# Patient Record
Sex: Female | Born: 1935 | Race: Black or African American | Hispanic: No | Smoking: Never smoker
Health system: Southern US, Community
[De-identification: ages and names within clinical notes are randomized; demographics above are authoritative.]

## PROBLEM LIST (undated history)

## (undated) ENCOUNTER — Emergency Department: Source: Home / Self Care

## (undated) DIAGNOSIS — I10 Essential (primary) hypertension: Secondary | ICD-10-CM

## (undated) HISTORY — PX: HERNIA REPAIR: SHX51

---

## 2008-10-09 ENCOUNTER — Inpatient Hospital Stay: Payer: Self-pay | Admitting: Surgery

## 2012-08-09 ENCOUNTER — Ambulatory Visit: Payer: Self-pay | Admitting: Internal Medicine

## 2016-04-17 ENCOUNTER — Emergency Department: Payer: Medicare Other

## 2016-04-17 ENCOUNTER — Emergency Department
Admission: EM | Admit: 2016-04-17 | Discharge: 2016-04-17 | Disposition: A | Discharge status: Home / Self Care | Payer: Medicare Other | Attending: Emergency Medicine | Admitting: Emergency Medicine

## 2016-04-17 ENCOUNTER — Encounter: Payer: Self-pay | Admitting: Emergency Medicine

## 2016-04-17 DIAGNOSIS — Y939 Activity, unspecified: Secondary | ICD-10-CM | POA: Insufficient documentation

## 2016-04-17 DIAGNOSIS — M25532 Pain in left wrist: Secondary | ICD-10-CM | POA: Insufficient documentation

## 2016-04-17 DIAGNOSIS — S3992XA Unspecified injury of lower back, initial encounter: Secondary | ICD-10-CM | POA: Diagnosis present

## 2016-04-17 DIAGNOSIS — I10 Essential (primary) hypertension: Secondary | ICD-10-CM | POA: Insufficient documentation

## 2016-04-17 DIAGNOSIS — Y999 Unspecified external cause status: Secondary | ICD-10-CM | POA: Diagnosis not present

## 2016-04-17 DIAGNOSIS — W01190A Fall on same level from slipping, tripping and stumbling with subsequent striking against furniture, initial encounter: Secondary | ICD-10-CM | POA: Diagnosis not present

## 2016-04-17 DIAGNOSIS — S300XXA Contusion of lower back and pelvis, initial encounter: Secondary | ICD-10-CM | POA: Diagnosis not present

## 2016-04-17 DIAGNOSIS — W19XXXA Unspecified fall, initial encounter: Secondary | ICD-10-CM

## 2016-04-17 DIAGNOSIS — Y92009 Unspecified place in unspecified non-institutional (private) residence as the place of occurrence of the external cause: Secondary | ICD-10-CM | POA: Insufficient documentation

## 2016-04-17 HISTORY — DX: Essential (primary) hypertension: I10

## 2016-04-17 NOTE — ED Provider Notes (Signed)
[facility] Emergency Department Provider Note  ____________________________________________  Time seen: Approximately 7:04 PM  I have reviewed the triage vital signs and the nursing notes.   HISTORY  Chief Complaint Fall    HPI Cindy Meyer is a 83 y.o. female who had a slip and fall at home falling onto her buttocks. She also complains of some left wrist pain from where she put her hand down to try and catch herself. She slipped because her grandson had sprayed pledge furniture polish on the floor causing her to be slick. Denies any head injury loss of consciousness or back pain. No other complaints.     Past Medical History:  Diagnosis Date  . Hypertension      There are no active problems to display for this patient.    History reviewed. No pertinent surgical history.   Prior to Admission medications   Not on File  Losartan hydrochlorothiazide   Allergies Patient has no known allergies.   No family history on file.  Social History Social History  Substance Use Topics  . Smoking status: Never Smoker  . Smokeless tobacco: Never Used  . Alcohol use No    Review of Systems  Constitutional:   No fever or chills.  ENT:   No sore throat. No rhinorrhea. Cardiovascular:   No chest pain. Respiratory:   No dyspnea or cough. Gastrointestinal:   Negative for abdominal pain, vomiting and diarrhea.  Genitourinary:   Negative for dysuria or difficulty urinating. Musculoskeletal:   Sacral left wrist pain Neurological:   Negative for headaches 10-point ROS otherwise negative.  ____________________________________________   PHYSICAL EXAM:  VITAL SIGNS: ED Triage Vitals  Enc Vitals Group     BP --      Pulse --      Resp 04/17/16 1814 16     Temp --      Temp src --      SpO2 04/17/16 1814 98 %     Weight --      Height --      Head Circumference --      Peak Flow --      Pain Score 04/17/16 1815 3     Pain Loc --    Pain Edu? --      Excl. in GC? --     Vital signs reviewed, nursing assessments reviewed.   Constitutional:   Alert and oriented. Well appearing and in no distress. Eyes:   No scleral icterus. No conjunctival pallor. PERRL. EOMI.  No nystagmus. ENT   Head:   Normocephalic and atraumatic.   Nose:   No congestion/rhinnorhea. No septal hematoma   Mouth/Throat:   MMM, no pharyngeal erythema. No peritonsillar mass.    Neck:   No stridor. No SubQ emphysema. No meningismus. Hematological/Lymphatic/Immunilogical:   No cervical lymphadenopathy. Cardiovascular:   RRR. Symmetric bilateral radial and DP pulses.  No murmurs.  Respiratory:   Normal respiratory effort without tachypnea nor retractions. Breath sounds are clear and equal bilaterally. No wheezes/rales/rhonchi. Gastrointestinal:   Soft and nontender. Non distended. There is no CVA tenderness.  No rebound, rigidity, or guarding. Genitourinary:   deferred Musculoskeletal:   Normal range of motion in all extremities. No joint effusions.  No lower extremity tenderness.  No edema. No midline spinal tenderness. Pelvis stable and nontender. There is some mild tenderness along the distal left radius without snuffbox tenderness. Neurologic:   Normal speech and language.  CN 2-10 normal. Motor grossly intact. No gross  focal neurologic deficits are appreciated.  Skin:    Skin is warm, dry and intact. No rash noted.  No petechiae, purpura, or bullae.  ____________________________________________    LABS (pertinent positives/negatives) (all labs ordered are listed, but only abnormal results are displayed) Labs Reviewed - No data to display ____________________________________________   EKG    ____________________________________________    RADIOLOGY  Dg Wrist Complete Left  Result Date: 04/17/2016 CLINICAL DATA:  Fall with wrist pain EXAM: LEFT WRIST - COMPLETE 3+ VIEW COMPARISON:  None. FINDINGS: Mild osteopenia. No  definite acute displaced fracture. Soft tissues unremarkable. IMPRESSION: No definite acute osseous abnormality. Radiographic follow-up recommended if persistent clinical concern for fracture. Electronically Signed   By: Jasmine PangKim  Fujinaga M.D.   On: 04/17/2016 18:46    ____________________________________________   PROCEDURES Procedures  ____________________________________________   INITIAL IMPRESSION / ASSESSMENT AND PLAN / ED COURSE  Pertinent labs & imaging results that were available during my care of the patient were reviewed by me and considered in my medical decision making (see chart for details).  Patient well appearing no acute distress, presents with slip and fall at home, and minor blunt trauma. X-ray of left wrist unremarkable. No other evidence of any acute injury. We'll discharge home follow up with primary care.         ____________________________________________   FINAL CLINICAL IMPRESSION(S) / ED DIAGNOSES  Final diagnoses:  Fall, initial encounter  Sacral contusion, initial encounter  Left wrist pain      New Prescriptions   No medications on file     Portions of this note were generated with dragon dictation software. Dictation errors may occur despite best attempts at proofreading.    Sharman CheekPhillip Irini Leet, MD 04/17/16 [phone]

## 2016-04-17 NOTE — ED Triage Notes (Signed)
Patient was at home today and grandchild sprayed the floor with pledge furniture polish and she slipped and fell on her buttocks.  She is co sacral pain and left wrist pain.  She is able to flex and extend her wrist on command and reports 3/10 pain when rotating her left wrist.  MD at bedside during triage.

## 2016-04-17 NOTE — ED Notes (Signed)
XR bedside.

## 2016-04-17 NOTE — ED Notes (Signed)
Family at bedside. 

## 2016-04-17 NOTE — Discharge Instructions (Signed)
Your wrist xray did not show any fractures. Follow up with your doctor for continued monitoring of your symptoms.

## 2017-08-19 ENCOUNTER — Emergency Department
Admission: EM | Admit: 2017-08-19 | Discharge: 2017-08-19 | Disposition: A | Discharge status: Home / Self Care | Payer: Medicare Other | Attending: Emergency Medicine | Admitting: Emergency Medicine

## 2017-08-19 ENCOUNTER — Other Ambulatory Visit: Payer: Self-pay

## 2017-08-19 DIAGNOSIS — B9562 Methicillin resistant Staphylococcus aureus infection as the cause of diseases classified elsewhere: Secondary | ICD-10-CM | POA: Insufficient documentation

## 2017-08-19 DIAGNOSIS — Z23 Encounter for immunization: Secondary | ICD-10-CM | POA: Diagnosis not present

## 2017-08-19 DIAGNOSIS — I10 Essential (primary) hypertension: Secondary | ICD-10-CM | POA: Insufficient documentation

## 2017-08-19 DIAGNOSIS — R222 Localized swelling, mass and lump, trunk: Secondary | ICD-10-CM | POA: Diagnosis present

## 2017-08-19 DIAGNOSIS — Z79899 Other long term (current) drug therapy: Secondary | ICD-10-CM | POA: Diagnosis not present

## 2017-08-19 DIAGNOSIS — L02212 Cutaneous abscess of back [any part, except buttock]: Secondary | ICD-10-CM | POA: Insufficient documentation

## 2017-08-19 DIAGNOSIS — L0291 Cutaneous abscess, unspecified: Secondary | ICD-10-CM

## 2017-08-19 DIAGNOSIS — A4902 Methicillin resistant Staphylococcus aureus infection, unspecified site: Secondary | ICD-10-CM

## 2017-08-19 MED ORDER — OXYCODONE-ACETAMINOPHEN 5-325 MG PO TABS
1.0000 | ORAL_TABLET | Freq: Once | ORAL | Status: AC
  Administered 2017-08-19: 1 via ORAL
  Filled 2017-08-19: qty 1

## 2017-08-19 MED ORDER — LIDOCAINE HCL (PF) 1 % IJ SOLN
5.0000 mL | Freq: Once | INTRAMUSCULAR | Status: DC
  Filled 2017-08-19: qty 5

## 2017-08-19 MED ORDER — LIDOCAINE-EPINEPHRINE 1 %-1:100000 IJ SOLN
10.0000 mL | Freq: Once | INTRAMUSCULAR | Status: AC
  Administered 2017-08-19: 10 mL via INTRADERMAL
  Filled 2017-08-19 (×2): qty 10

## 2017-08-19 MED ORDER — LIDOCAINE HCL (PF) 1 % IJ SOLN: 5.0000 mL | Freq: Once | INTRAMUSCULAR | Status: DC

## 2017-08-19 MED ORDER — TETANUS-DIPHTH-ACELL PERTUSSIS 5-2.5-18.5 LF-MCG/0.5 IM SUSP
0.5000 mL | Freq: Once | INTRAMUSCULAR | Status: AC
  Administered 2017-08-19: 0.5 mL via INTRAMUSCULAR
  Filled 2017-08-19: qty 0.5

## 2017-08-19 NOTE — ED Provider Notes (Signed)
[facility] Emergency Department Provider Note  ____________________________________________   First MD Initiated Contact with Patient 08/19/17 2121     (approximate)  I have reviewed the triage vital signs and the nursing notes.   HISTORY  Chief Complaint Abscess   HPI Cindy Meyer is a 84 y.o. female who self presents to the emergency department with several days of a painful mass to her right upper back.  Insidious onset slowly progressive now moderate severity.  No fevers or chills.    Past Medical History:  Diagnosis Date  . Hypertension     There are no active problems to display for this patient.   History reviewed. No pertinent surgical history.  Prior to Admission medications   Medication Sig Start Date End Date Taking? Authorizing Provider  losartan-hydrochlorothiazide (HYZAAR) 50-12.5 MG tablet Take 1 tablet by mouth daily.   Yes [provider]  Multiple Vitamin (MULTIVITAMIN WITH MINERALS) TABS tablet Take 1 tablet by mouth daily.   Yes [provider]    Allergies Patient has no known allergies.  No family history on file.  Social History Social History   Tobacco Use  . Smoking status: Never Smoker  . Smokeless tobacco: Never Used  Substance Use Topics  . Alcohol use: No  . Drug use: Not on file    Review of Systems Constitutional: No fever/chills Cardiovascular: Denies chest pain. Respiratory: Denies shortness of breath. Gastrointestinal: No abdominal pain.  No nausea, no vomiting.   Musculoskeletal: Negative for back pain. Neurological: Negative for headaches   ____________________________________________   PHYSICAL EXAM:  VITAL SIGNS: ED Triage Vitals  Enc Vitals Group     BP 08/19/17 2115 (!) 201/105     Pulse Rate 08/19/17 2115 (!) 121     Resp 08/19/17 2115 18     Temp 08/19/17 2115 98 F (36.7 C)     Temp Source 08/19/17 2115 Oral     SpO2 08/19/17 2115 98 %     Weight  08/19/17 2110 110 lb (49.9 kg)     Height 08/19/17 2110 5\' 10"  (1.778 m)     Head Circumference --      Peak Flow --      Pain Score 08/19/17 2109 9     Pain Loc --      Pain Edu? --      Excl. in GC? --     Constitutional: Does not cooperative uncomfortable appearing Head: Atraumatic. Nose: No congestion/rhinnorhea. Mouth/Throat: No trismus Neck: No stridor.   Cardiovascular: Tachycardic Respiratory: Normal respiratory effort.  No retractions. Gastrointestinal: Soft nontender Neurologic:  Normal speech and language. No gross focal neurologic deficits are appreciated.  Skin: 7 cm abscess to right posterior shoulder coming to ahead with no overlying cellulitis.    ____________________________________________  LABS (all labs ordered are listed, but only abnormal results are displayed)  Labs Reviewed - No data to display   __________________________________________  EKG   ____________________________________________  RADIOLOGY   ____________________________________________   DIFFERENTIAL includes but not limited to  Abscess, cellulitis, necrotizing soft tissue infection, sebaceous cyst   PROCEDURES  Procedure(s) performed: Yes  .Marland Kitchen.Incision and Drainage Date/Time: 08/21/2017 2:04 AM Performed by: Merrily Brittleifenbark, Avacyn Kloosterman, MD Authorized by: Merrily Brittleifenbark, Poppi Scantling, MD   Consent:    Consent obtained:  Verbal   Consent given by:  Patient   Risks discussed:  Bleeding, infection, incomplete drainage and pain   Alternatives discussed:  Alternative treatment, delayed treatment and observation Location:    Type:  Abscess   Size:  7 cm   Location:  Trunk   Trunk location:  Back Pre-procedure details:    Skin preparation:  Chloraprep Anesthesia (see MAR for exact dosages):    Anesthesia method:  Local infiltration   Local anesthetic:  Lidocaine 1% WITH epi Procedure type:    Complexity:  Complex Procedure details:    Needle aspiration: no     Incision types:  Single  straight   Incision depth:  Dermal   Scalpel blade:  11   Wound management:  Probed and deloculated and debrided   Drainage:  Purulent   Drainage amount:  Moderate   Wound treatment:  Wound left open Post-procedure details:    Patient tolerance of procedure:  Tolerated well, no immediate complications    Critical Care performed: no  ____________________________________________   INITIAL IMPRESSION / ASSESSMENT AND PLAN / ED COURSE  Pertinent labs & imaging results that were available during my care of the patient were reviewed by me and considered in my medical decision making (see chart for details).   The patient arrives uncomfortable appearing with an obvious abscess.  Verbally consented for incision and drainage performed with removal of a copious amounts of purulent material.  All loculations broken up.  No indication for antibiotics.  Patient is discharged home with a 2-day wound check.      ____________________________________________   FINAL CLINICAL IMPRESSION(S) / ED DIAGNOSES  Final diagnoses:  Abscess  MRSA (methicillin resistant Staphylococcus aureus)      NEW MEDICATIONS STARTED DURING THIS VISIT:  Discharge Medication List as of 08/19/2017 10:56 PM       Note:  This document was prepared using Dragon voice recognition software and may include unintentional dictation errors.      Merrily Brittle, MD 08/21/17 [phone]

## 2017-08-19 NOTE — ED Triage Notes (Signed)
Pt arrives to ED via POV with c/o abscess on the right shoulder possibly related to an insect bite on Sunday. Pt has a very large, golf-ball sized area of swelling noted with a small area of yellow drainage at the site. Pt denies any c/o N/V/D or fever.

## 2017-08-19 NOTE — Discharge Instructions (Signed)
Please purchase over-the-counter HIBICLENS and wash with it several times to help prevent these infections in the future.  Make sure you follow-up with your primary care physician on Monday for a wound recheck and return to the emergency department for any concerns.  It was a pleasure to take care of you today, and thank you for coming to our emergency department.  If you have any questions or concerns before leaving please ask the nurse to grab me and I'm more than happy to go through your aftercare instructions again.  If you were prescribed any opioid pain medication today such as Norco, Vicodin, Percocet, morphine, hydrocodone, or oxycodone please make sure you do not drive when you are taking this medication as it can alter your ability to drive safely.  If you have any concerns once you are home that you are not improving or are in fact getting worse before you can make it to your follow-up appointment, please do not hesitate to call 911 and come back for further evaluation.  Merrily BrittleNeil Marcelus Dubberly, MD

## 2018-07-10 ENCOUNTER — Emergency Department: Payer: Medicare Other

## 2018-07-10 ENCOUNTER — Encounter: Payer: Self-pay | Admitting: Emergency Medicine

## 2018-07-10 ENCOUNTER — Inpatient Hospital Stay
Admission: EM | Admit: 2018-07-10 | Discharge: 2018-07-14 | DRG: 481 | Disposition: A | Discharge status: Skilled Nursing Facility | Payer: Medicare Other | Attending: Internal Medicine | Admitting: Internal Medicine

## 2018-07-10 ENCOUNTER — Other Ambulatory Visit: Payer: Self-pay

## 2018-07-10 DIAGNOSIS — K922 Gastrointestinal hemorrhage, unspecified: Secondary | ICD-10-CM | POA: Diagnosis present

## 2018-07-10 DIAGNOSIS — D62 Acute posthemorrhagic anemia: Secondary | ICD-10-CM | POA: Diagnosis present

## 2018-07-10 DIAGNOSIS — D649 Anemia, unspecified: Secondary | ICD-10-CM | POA: Diagnosis present

## 2018-07-10 DIAGNOSIS — I10 Essential (primary) hypertension: Secondary | ICD-10-CM | POA: Diagnosis present

## 2018-07-10 DIAGNOSIS — Z1159 Encounter for screening for other viral diseases: Secondary | ICD-10-CM

## 2018-07-10 DIAGNOSIS — W010XXA Fall on same level from slipping, tripping and stumbling without subsequent striking against object, initial encounter: Secondary | ICD-10-CM | POA: Diagnosis present

## 2018-07-10 DIAGNOSIS — S72001A Fracture of unspecified part of neck of right femur, initial encounter for closed fracture: Principal | ICD-10-CM | POA: Diagnosis present

## 2018-07-10 DIAGNOSIS — Y92018 Other place in single-family (private) house as the place of occurrence of the external cause: Secondary | ICD-10-CM

## 2018-07-10 DIAGNOSIS — S72009A Fracture of unspecified part of neck of unspecified femur, initial encounter for closed fracture: Secondary | ICD-10-CM | POA: Diagnosis present

## 2018-07-10 DIAGNOSIS — Z419 Encounter for procedure for purposes other than remedying health state, unspecified: Secondary | ICD-10-CM

## 2018-07-10 LAB — CBC WITH DIFFERENTIAL/PLATELET
Abs Immature Granulocytes: 0.02 10*3/uL (ref 0.00–0.07)
Basophils Absolute: 0 10*3/uL (ref 0.0–0.1)
Basophils Relative: 0 %
Eosinophils Absolute: 0.2 10*3/uL (ref 0.0–0.5)
Eosinophils Relative: 3 %
HCT: 38.8 % (ref 36.0–46.0)
Hemoglobin: 12 g/dL (ref 12.0–15.0)
Immature Granulocytes: 0 %
Lymphocytes Relative: 35 %
Lymphs Abs: 2.7 10*3/uL (ref 0.7–4.0)
MCH: 28.2 pg (ref 26.0–34.0)
MCHC: 30.9 g/dL (ref 30.0–36.0)
MCV: 91.3 fL (ref 80.0–100.0)
Monocytes Absolute: 0.7 10*3/uL (ref 0.1–1.0)
Monocytes Relative: 9 %
Neutro Abs: 4.1 10*3/uL (ref 1.7–7.7)
Neutrophils Relative %: 53 %
Platelets: 182 10*3/uL (ref 150–400)
RBC: 4.25 MIL/uL (ref 3.87–5.11)
RDW: 13.4 % (ref 11.5–15.5)
WBC: 7.8 10*3/uL (ref 4.0–10.5)
nRBC: 0 % (ref 0.0–0.2)

## 2018-07-10 LAB — COMPREHENSIVE METABOLIC PANEL
ALT: 13 U/L (ref 0–44)
AST: 59 U/L — ABNORMAL HIGH (ref 15–41)
Albumin: 3.8 g/dL (ref 3.5–5.0)
Alkaline Phosphatase: 62 U/L (ref 38–126)
Anion gap: 9 (ref 5–15)
BUN: 28 mg/dL — ABNORMAL HIGH (ref 8–23)
CO2: 27 mmol/L (ref 22–32)
Calcium: 9.4 mg/dL (ref 8.9–10.3)
Chloride: 104 mmol/L (ref 98–111)
Creatinine, Ser: 1.03 mg/dL — ABNORMAL HIGH (ref 0.44–1.00)
GFR calc Af Amer: 56 mL/min — ABNORMAL LOW (ref >60)
GFR calc non Af Amer: 48 mL/min — ABNORMAL LOW (ref >60)
Glucose, Bld: 132 mg/dL — ABNORMAL HIGH (ref 70–99)
Potassium: 3.2 mmol/L — ABNORMAL LOW (ref 3.5–5.1)
Sodium: 140 mmol/L (ref 135–145)
Total Bilirubin: 0.6 mg/dL (ref 0.3–1.2)
Total Protein: 8 g/dL (ref 6.5–8.1)

## 2018-07-10 MED ORDER — FENTANYL CITRATE (PF) 100 MCG/2ML IJ SOLN
12.5000 ug | INTRAMUSCULAR | Status: DC | PRN
  Administered 2018-07-10: 12.5 ug via INTRAVENOUS
  Filled 2018-07-10: qty 2

## 2018-07-10 NOTE — ED Provider Notes (Signed)
[facility] Emergency Department Provider Note    First MD Initiated Contact with Patient 07/10/18 2303     (approximate)  I have reviewed the triage vital signs and the nursing notes.   HISTORY  Chief Complaint Fall and Hip Pain    HPI Cindy Meyer is a 85 y.o. female below listed past medical history presents the ER for evaluation of right hip pain that occurred after mechanical fall this evening.  States she was at home got up to walk to the kitchen and tripped over a shoe that her family member had left on the floor.  She fell hitting her right hip against a washer.  Did not hit her head.  Was unable to bear weight after the fall.  Denies any chest pain or shortness of breath.  She is not on any anticoagulation or antiplatelet medications.  Denies any numbness or tingling.  States the pain is mild to moderate.    Past Medical History:  Diagnosis Date  . Hypertension    No family history on file. History reviewed. No pertinent surgical history. There are no active problems to display for this patient.     Prior to Admission medications   Medication Sig Start Date End Date Taking? Authorizing Provider  losartan-hydrochlorothiazide (HYZAAR) 50-12.5 MG tablet Take 1 tablet by mouth daily.    [provider]  Multiple Vitamin (MULTIVITAMIN WITH MINERALS) TABS tablet Take 1 tablet by mouth daily.    [provider]    Allergies Patient has no known allergies.    Social History Social History   Tobacco Use  . Smoking status: Never Smoker  . Smokeless tobacco: Never Used  Substance Use Topics  . Alcohol use: No  . Drug use: Never    Review of Systems Patient denies headaches, rhinorrhea, blurry vision, numbness, shortness of breath, chest pain, edema, cough, abdominal pain, nausea, vomiting, diarrhea, dysuria, fevers, rashes or hallucinations unless otherwise stated above in HPI.  ____________________________________________   PHYSICAL EXAM:  VITAL SIGNS: Vitals:   07/10/18 2249  Pulse: 94  Resp: 17  Temp: 98.6 F (37 C)  SpO2: 98%    Constitutional: Alert and oriented.  Eyes: Conjunctivae are normal.  Head: Atraumatic. Nose: No congestion/rhinnorhea. Mouth/Throat: Mucous membranes are moist.   Neck: No stridor. Painless ROM.  Cardiovascular: Normal rate, regular rhythm. Grossly normal heart sounds.  Good peripheral circulation. Respiratory: Normal respiratory effort.  No retractions. Lungs CTAB. Gastrointestinal: Soft and nontender. No distention. No abdominal bruits. No CVA tenderness. Genitourinary: deferred Musculoskeletal: Right lower extremity is shortened and externally rotated.  There is pain with palpation of the right hip.  Neurovascularly is intact distally.  Thigh compartment is soft and compressible..  No joint effusions. Neurologic:  Normal speech and language. No gross focal neurologic deficits are appreciated. No facial droop Skin:  Skin is warm, dry and intact. No rash noted. Psychiatric: Mood and affect are normal. Speech and behavior are normal.  ____________________________________________   LABS (all labs ordered are listed, but only abnormal results are displayed)  Results for orders placed or performed during the hospital encounter of 07/10/18 (from the past 24 hour(s))  CBC with Differential/Platelet     Status: None   Collection Time: 07/10/18 10:51 PM  Result Value Ref Range   WBC 7.8 4.0 - 10.5 K/uL   RBC 4.25 3.87 - 5.11 MIL/uL   Hemoglobin 12.0 12.0 - 15.0 g/dL   HCT 09.3 26.7 - 12.4 %   MCV  91.3 80.0 - 100.0 fL   MCH 28.2 26.0 - 34.0 pg   MCHC 30.9 30.0 - 36.0 g/dL   RDW 40.913.4 81.111.5 - 91.415.5 %   Platelets 182 150 - 400 K/uL   nRBC 0.0 0.0 - 0.2 %   Neutrophils Relative % 53 %   Neutro Abs 4.1 1.7 - 7.7 K/uL   Lymphocytes Relative 35 %   Lymphs Abs 2.7 0.7 - 4.0 K/uL   Monocytes Relative 9 %   Monocytes Absolute  0.7 0.1 - 1.0 K/uL   Eosinophils Relative 3 %   Eosinophils Absolute 0.2 0.0 - 0.5 K/uL   Basophils Relative 0 %   Basophils Absolute 0.0 0.0 - 0.1 K/uL   Immature Granulocytes 0 %   Abs Immature Granulocytes 0.02 0.00 - 0.07 K/uL  Comprehensive metabolic panel     Status: Abnormal   Collection Time: 07/10/18 10:51 PM  Result Value Ref Range   Sodium 140 135 - 145 mmol/L   Potassium 3.2 (L) 3.5 - 5.1 mmol/L   Chloride 104 98 - 111 mmol/L   CO2 27 22 - 32 mmol/L   Glucose, Bld 132 (H) 70 - 99 mg/dL   BUN 28 (H) 8 - 23 mg/dL   Creatinine, Ser 7.821.03 (H) 0.44 - 1.00 mg/dL   Calcium 9.4 8.9 - 95.610.3 mg/dL   Total Protein 8.0 6.5 - 8.1 g/dL   Albumin 3.8 3.5 - 5.0 g/dL   AST 59 (H) 15 - 41 U/L   ALT 13 0 - 44 U/L   Alkaline Phosphatase 62 38 - 126 U/L   Total Bilirubin 0.6 0.3 - 1.2 mg/dL   GFR calc non Af Amer 48 (L) >60 mL/min   GFR calc Af Amer 56 (L) >60 mL/min   Anion gap 9 5 - 15   ____________________________________________  EKG My review and personal interpretation at Time: 22:50   Indication: right hip pain  Rate: 95  Rhythm: sinus Axis: normal Other: irbbb, no stemi ____________________________________________  RADIOLOGY  I personally reviewed all radiographic images ordered to evaluate for the above acute complaints and reviewed radiology reports and findings.  These findings were personally discussed with the patient.  Please see medical record for radiology report.  ____________________________________________   PROCEDURES  Procedure(s) performed:  Procedures    Critical Care performed: no ____________________________________________   INITIAL IMPRESSION / ASSESSMENT AND PLAN / ED COURSE  Pertinent labs & imaging results that were available during my care of the patient were reviewed by me and considered in my medical decision making (see chart for details).   DDX: fracture, contusion, dislocation,   Cindy Meyer is a 85 y.o. who presents to the ED  with injury to her right hip as described above.  She is afebrile hemodynamically stable.  No evidence of head injury.  She is on blood thinners.  X-ray imaging shows evidence of right proximal femur oblique fracture.  Neurovascular is intact distally.  No evidence of compartment syndrome.  Discussed case in consultation with Dr. Rosita KeaMenz.  Patient will be admitted to the hospital for further medical as well as orthopedic management.  Have discussed with the patient and available family all diagnostics and treatments performed thus far and all questions were answered to the best of my ability. The patient demonstrates understanding and agreement with plan.      The patient was evaluated in Emergency Department today for the symptoms described in the history of present illness. He/she was evaluated in the context  of the global COVID-19 pandemic, which necessitated consideration that the patient might be at risk for infection with the SARS-CoV-2 virus that causes COVID-19. Institutional protocols and algorithms that pertain to the evaluation of patients at risk for COVID-19 are in a state of rapid change based on information released by regulatory bodies including the CDC and federal and state organizations. These policies and algorithms were followed during the patient's care in the ED.  As part of my medical decision making, I reviewed the following data within the electronic MEDICAL RECORD NUMBER Nursing notes reviewed and incorporated, Labs reviewed, notes from prior ED visits and [place] Controlled Substance Database   ____________________________________________   FINAL CLINICAL IMPRESSION(S) / ED DIAGNOSES  Final diagnoses:  Hip fx, right, closed, initial encounter (HCC)      NEW MEDICATIONS STARTED DURING THIS VISIT:  New Prescriptions   No medications on file     Note:  This document was prepared using Dragon voice recognition software and may include unintentional dictation errors.     Willy Eddy, MD 07/10/18 2358

## 2018-07-10 NOTE — ED Triage Notes (Signed)
Reports tripped over family members shoe, fell into the washing machine then to the floor.  Patient denies hitting head or loss of consciousness.

## 2018-07-11 ENCOUNTER — Encounter: Payer: Self-pay | Admitting: Internal Medicine

## 2018-07-11 ENCOUNTER — Inpatient Hospital Stay: Payer: Medicare Other | Admitting: Anesthesiology

## 2018-07-11 ENCOUNTER — Other Ambulatory Visit: Payer: Self-pay

## 2018-07-11 ENCOUNTER — Inpatient Hospital Stay: Payer: Medicare Other

## 2018-07-11 ENCOUNTER — Encounter
Admission: EM | Disposition: A | Discharge status: Skilled Nursing Facility | Payer: Self-pay | Source: Home / Self Care | Attending: Internal Medicine

## 2018-07-11 DIAGNOSIS — Y92018 Other place in single-family (private) house as the place of occurrence of the external cause: Secondary | ICD-10-CM | POA: Diagnosis not present

## 2018-07-11 DIAGNOSIS — W010XXA Fall on same level from slipping, tripping and stumbling without subsequent striking against object, initial encounter: Secondary | ICD-10-CM | POA: Diagnosis present

## 2018-07-11 DIAGNOSIS — K922 Gastrointestinal hemorrhage, unspecified: Secondary | ICD-10-CM | POA: Diagnosis present

## 2018-07-11 DIAGNOSIS — I10 Essential (primary) hypertension: Secondary | ICD-10-CM | POA: Diagnosis present

## 2018-07-11 DIAGNOSIS — S72001A Fracture of unspecified part of neck of right femur, initial encounter for closed fracture: Secondary | ICD-10-CM | POA: Diagnosis present

## 2018-07-11 DIAGNOSIS — Z1159 Encounter for screening for other viral diseases: Secondary | ICD-10-CM | POA: Diagnosis not present

## 2018-07-11 DIAGNOSIS — S72009A Fracture of unspecified part of neck of unspecified femur, initial encounter for closed fracture: Secondary | ICD-10-CM | POA: Diagnosis present

## 2018-07-11 DIAGNOSIS — D649 Anemia, unspecified: Secondary | ICD-10-CM | POA: Diagnosis present

## 2018-07-11 DIAGNOSIS — D62 Acute posthemorrhagic anemia: Secondary | ICD-10-CM | POA: Diagnosis present

## 2018-07-11 HISTORY — PX: INTRAMEDULLARY (IM) NAIL INTERTROCHANTERIC: SHX5875

## 2018-07-11 LAB — BASIC METABOLIC PANEL
Anion gap: 9 (ref 5–15)
BUN: 26 mg/dL — ABNORMAL HIGH (ref 8–23)
CO2: 28 mmol/L (ref 22–32)
Calcium: 9 mg/dL (ref 8.9–10.3)
Chloride: 105 mmol/L (ref 98–111)
Creatinine, Ser: 0.98 mg/dL (ref 0.44–1.00)
GFR calc Af Amer: 59 mL/min — ABNORMAL LOW (ref >60)
GFR calc non Af Amer: 51 mL/min — ABNORMAL LOW (ref >60)
Glucose, Bld: 165 mg/dL — ABNORMAL HIGH (ref 70–99)
Potassium: 3.5 mmol/L (ref 3.5–5.1)
Sodium: 142 mmol/L (ref 135–145)

## 2018-07-11 LAB — CBC
HCT: 34.5 % — ABNORMAL LOW (ref 36.0–46.0)
Hemoglobin: 10.8 g/dL — ABNORMAL LOW (ref 12.0–15.0)
MCH: 28.2 pg (ref 26.0–34.0)
MCHC: 31.3 g/dL (ref 30.0–36.0)
MCV: 90.1 fL (ref 80.0–100.0)
Platelets: 180 10*3/uL (ref 150–400)
RBC: 3.83 MIL/uL — ABNORMAL LOW (ref 3.87–5.11)
RDW: 13.3 % (ref 11.5–15.5)
WBC: 8.1 10*3/uL (ref 4.0–10.5)
nRBC: 0 % (ref 0.0–0.2)

## 2018-07-11 LAB — PROTIME-INR
INR: 1 (ref 0.8–1.2)
Prothrombin Time: 13 seconds (ref 11.4–15.2)

## 2018-07-11 LAB — SURGICAL PCR SCREEN
MRSA, PCR: NEGATIVE
Staphylococcus aureus: NEGATIVE

## 2018-07-11 LAB — ABO/RH: ABO/RH(D): A POS

## 2018-07-11 LAB — SARS CORONAVIRUS 2 BY RT PCR (HOSPITAL ORDER, PERFORMED IN ~~LOC~~ HOSPITAL LAB): SARS Coronavirus 2: NEGATIVE

## 2018-07-11 SURGERY — FIXATION, FRACTURE, INTERTROCHANTERIC, WITH INTRAMEDULLARY ROD
Anesthesia: General | Site: Hip | Laterality: Right

## 2018-07-11 MED ORDER — MORPHINE SULFATE (PF) 2 MG/ML IV SOLN
2.0000 mg | INTRAVENOUS | Status: DC | PRN
  Administered 2018-07-11 (×2): 2 mg via INTRAVENOUS
  Filled 2018-07-11 (×2): qty 1

## 2018-07-11 MED ORDER — SUCCINYLCHOLINE CHLORIDE 20 MG/ML IJ SOLN
INTRAMUSCULAR | Status: AC
  Filled 2018-07-11: qty 1

## 2018-07-11 MED ORDER — ACETAMINOPHEN 325 MG PO TABS
650.0000 mg | ORAL_TABLET | Freq: Four times a day (QID) | ORAL | Status: DC | PRN
  Filled 2018-07-11: qty 2

## 2018-07-11 MED ORDER — MUPIROCIN 2 % EX OINT
1.0000 "application " | TOPICAL_OINTMENT | Freq: Two times a day (BID) | CUTANEOUS | Status: DC
  Filled 2018-07-11: qty 22

## 2018-07-11 MED ORDER — ONDANSETRON HCL 4 MG PO TABS: 4.0000 mg | ORAL_TABLET | Freq: Four times a day (QID) | ORAL | Status: DC | PRN

## 2018-07-11 MED ORDER — PANTOPRAZOLE SODIUM 40 MG IV SOLR
8.0000 mg/h | INTRAVENOUS | Status: DC
  Administered 2018-07-11: 8 mg/h via INTRAVENOUS
  Filled 2018-07-11: qty 80

## 2018-07-11 MED ORDER — MORPHINE SULFATE (PF) 2 MG/ML IV SOLN: 0.5000 mg | INTRAVENOUS | Status: DC | PRN

## 2018-07-11 MED ORDER — BISACODYL 10 MG RE SUPP
10.0000 mg | Freq: Every day | RECTAL | Status: DC | PRN
  Administered 2018-07-14: 15:00:00 10 mg via RECTAL
  Filled 2018-07-11: qty 1

## 2018-07-11 MED ORDER — METOCLOPRAMIDE HCL 5 MG/ML IJ SOLN: 5.0000 mg | Freq: Three times a day (TID) | INTRAMUSCULAR | Status: DC | PRN

## 2018-07-11 MED ORDER — ROCURONIUM BROMIDE 100 MG/10ML IV SOLN
INTRAVENOUS | Status: DC | PRN
  Administered 2018-07-11: 30 mg via INTRAVENOUS

## 2018-07-11 MED ORDER — CEFAZOLIN SODIUM-DEXTROSE 1-4 GM/50ML-% IV SOLN
1.0000 g | Freq: Four times a day (QID) | INTRAVENOUS | Status: AC
  Administered 2018-07-11 – 2018-07-12 (×3): 1 g via INTRAVENOUS
  Filled 2018-07-11 (×3): qty 50

## 2018-07-11 MED ORDER — MENTHOL 3 MG MT LOZG
1.0000 | LOZENGE | OROMUCOSAL | Status: DC | PRN
  Filled 2018-07-11: qty 9

## 2018-07-11 MED ORDER — ESMOLOL HCL 100 MG/10ML IV SOLN
INTRAVENOUS | Status: DC | PRN
  Administered 2018-07-11: 10 mg via INTRAVENOUS

## 2018-07-11 MED ORDER — CEFAZOLIN SODIUM-DEXTROSE 1-4 GM/50ML-% IV SOLN
1.0000 g | Freq: Once | INTRAVENOUS | Status: AC
  Administered 2018-07-11: 17:00:00 1 g via INTRAVENOUS
  Filled 2018-07-11: qty 50

## 2018-07-11 MED ORDER — ONDANSETRON HCL 4 MG/2ML IJ SOLN
INTRAMUSCULAR | Status: AC
  Filled 2018-07-11: qty 2

## 2018-07-11 MED ORDER — LIDOCAINE HCL (CARDIAC) PF 100 MG/5ML IV SOSY
PREFILLED_SYRINGE | INTRAVENOUS | Status: DC | PRN
  Administered 2018-07-11: 50 mg via INTRAVENOUS

## 2018-07-11 MED ORDER — ESMOLOL HCL 100 MG/10ML IV SOLN
INTRAVENOUS | Status: AC
  Filled 2018-07-11: qty 10

## 2018-07-11 MED ORDER — METOCLOPRAMIDE HCL 10 MG PO TABS: 5.0000 mg | ORAL_TABLET | Freq: Three times a day (TID) | ORAL | Status: DC | PRN

## 2018-07-11 MED ORDER — PHENOL 1.4 % MT LIQD
1.0000 | OROMUCOSAL | Status: DC | PRN
  Filled 2018-07-11: qty 177

## 2018-07-11 MED ORDER — ROCURONIUM BROMIDE 50 MG/5ML IV SOLN
INTRAVENOUS | Status: AC
  Filled 2018-07-11: qty 1

## 2018-07-11 MED ORDER — ENOXAPARIN SODIUM 30 MG/0.3ML ~~LOC~~ SOLN
30.0000 mg | SUBCUTANEOUS | Status: DC
  Administered 2018-07-12 – 2018-07-14 (×3): 30 mg via SUBCUTANEOUS
  Filled 2018-07-11 (×3): qty 0.3

## 2018-07-11 MED ORDER — ACETAMINOPHEN 10 MG/ML IV SOLN
INTRAVENOUS | Status: AC
  Filled 2018-07-11: qty 100

## 2018-07-11 MED ORDER — LACTATED RINGERS IV SOLN
INTRAVENOUS | Status: DC
  Administered 2018-07-11 (×2): via INTRAVENOUS

## 2018-07-11 MED ORDER — ONDANSETRON HCL 4 MG/2ML IJ SOLN
4.0000 mg | Freq: Once | INTRAMUSCULAR | Status: AC
  Administered 2018-07-11: 4 mg via INTRAVENOUS
  Filled 2018-07-11: qty 2

## 2018-07-11 MED ORDER — DOCUSATE SODIUM 100 MG PO CAPS
100.0000 mg | ORAL_CAPSULE | Freq: Two times a day (BID) | ORAL | Status: DC
  Administered 2018-07-11 – 2018-07-14 (×6): 100 mg via ORAL
  Filled 2018-07-11 (×6): qty 1

## 2018-07-11 MED ORDER — DEXAMETHASONE SODIUM PHOSPHATE 10 MG/ML IJ SOLN
INTRAMUSCULAR | Status: AC
  Filled 2018-07-11: qty 1

## 2018-07-11 MED ORDER — ACETAMINOPHEN 500 MG PO TABS
500.0000 mg | ORAL_TABLET | Freq: Four times a day (QID) | ORAL | Status: AC
  Administered 2018-07-11 – 2018-07-12 (×4): 500 mg via ORAL
  Filled 2018-07-11 (×4): qty 1

## 2018-07-11 MED ORDER — PHENYLEPHRINE HCL (PRESSORS) 10 MG/ML IV SOLN
INTRAVENOUS | Status: DC | PRN
  Administered 2018-07-11: 100 ug via INTRAVENOUS
  Administered 2018-07-11: 150 ug via INTRAVENOUS
  Administered 2018-07-11 (×2): 100 ug via INTRAVENOUS

## 2018-07-11 MED ORDER — HYDROCODONE-ACETAMINOPHEN 5-325 MG PO TABS
1.0000 | ORAL_TABLET | ORAL | Status: DC | PRN
  Administered 2018-07-12 – 2018-07-14 (×3): 1 via ORAL
  Filled 2018-07-11 (×3): qty 1

## 2018-07-11 MED ORDER — SUGAMMADEX SODIUM 200 MG/2ML IV SOLN
INTRAVENOUS | Status: AC
  Filled 2018-07-11: qty 2

## 2018-07-11 MED ORDER — PROPOFOL 10 MG/ML IV BOLUS
INTRAVENOUS | Status: DC | PRN
  Administered 2018-07-11: 80 mg via INTRAVENOUS

## 2018-07-11 MED ORDER — MAGNESIUM CITRATE PO SOLN
1.0000 | Freq: Once | ORAL | Status: DC | PRN
  Filled 2018-07-11: qty 296

## 2018-07-11 MED ORDER — FENTANYL CITRATE (PF) 100 MCG/2ML IJ SOLN
INTRAMUSCULAR | Status: AC
  Filled 2018-07-11: qty 2

## 2018-07-11 MED ORDER — ALUM & MAG HYDROXIDE-SIMETH 200-200-20 MG/5ML PO SUSP: 30.0000 mL | ORAL | Status: DC | PRN

## 2018-07-11 MED ORDER — METHOCARBAMOL 1000 MG/10ML IJ SOLN
500.0000 mg | Freq: Four times a day (QID) | INTRAVENOUS | Status: DC | PRN
  Filled 2018-07-11: qty 5

## 2018-07-11 MED ORDER — ONDANSETRON HCL 4 MG/2ML IJ SOLN
INTRAMUSCULAR | Status: DC | PRN
  Administered 2018-07-11: 4 mg via INTRAVENOUS

## 2018-07-11 MED ORDER — LIDOCAINE HCL (PF) 2 % IJ SOLN
INTRAMUSCULAR | Status: AC
  Filled 2018-07-11: qty 10

## 2018-07-11 MED ORDER — FENTANYL CITRATE (PF) 100 MCG/2ML IJ SOLN: 25.0000 ug | INTRAMUSCULAR | Status: DC | PRN

## 2018-07-11 MED ORDER — SODIUM CHLORIDE 0.9 % IV SOLN
INTRAVENOUS | Status: DC
  Administered 2018-07-11: 22:00:00 via INTRAVENOUS

## 2018-07-11 MED ORDER — ONDANSETRON HCL 4 MG/2ML IJ SOLN
4.0000 mg | Freq: Four times a day (QID) | INTRAMUSCULAR | Status: DC | PRN
  Filled 2018-07-11: qty 2

## 2018-07-11 MED ORDER — SUGAMMADEX SODIUM 200 MG/2ML IV SOLN
INTRAVENOUS | Status: DC | PRN
  Administered 2018-07-11: 100 mg via INTRAVENOUS

## 2018-07-11 MED ORDER — ONDANSETRON HCL 4 MG/2ML IJ SOLN: 4.0000 mg | Freq: Once | INTRAMUSCULAR | Status: DC | PRN

## 2018-07-11 MED ORDER — PANTOPRAZOLE SODIUM 40 MG IV SOLR: 40.0000 mg | Freq: Two times a day (BID) | INTRAVENOUS | Status: DC

## 2018-07-11 MED ORDER — METHOCARBAMOL 500 MG PO TABS: 500.0000 mg | ORAL_TABLET | Freq: Four times a day (QID) | ORAL | Status: DC | PRN

## 2018-07-11 MED ORDER — MAGNESIUM HYDROXIDE 400 MG/5ML PO SUSP
30.0000 mL | Freq: Every day | ORAL | Status: DC | PRN
  Administered 2018-07-13 – 2018-07-14 (×2): 30 mL via ORAL
  Filled 2018-07-11 (×2): qty 30

## 2018-07-11 MED ORDER — ACETAMINOPHEN 650 MG RE SUPP: 650.0000 mg | Freq: Four times a day (QID) | RECTAL | Status: DC | PRN

## 2018-07-11 MED ORDER — HYDROCHLOROTHIAZIDE 25 MG PO TABS
12.5000 mg | ORAL_TABLET | Freq: Every day | ORAL | Status: DC
  Administered 2018-07-11 – 2018-07-14 (×4): 12.5 mg via ORAL
  Filled 2018-07-11 (×4): qty 1

## 2018-07-11 MED ORDER — SODIUM CHLORIDE 0.9 % IV SOLN
80.0000 mg | Freq: Once | INTRAVENOUS | Status: AC
  Administered 2018-07-11: 80 mg via INTRAVENOUS
  Filled 2018-07-11: qty 80

## 2018-07-11 MED ORDER — DEXAMETHASONE SODIUM PHOSPHATE 10 MG/ML IJ SOLN
INTRAMUSCULAR | Status: DC | PRN
  Administered 2018-07-11: 4 mg via INTRAVENOUS

## 2018-07-11 MED ORDER — ACETAMINOPHEN 10 MG/ML IV SOLN
INTRAVENOUS | Status: DC | PRN
  Administered 2018-07-11: 650 mg via INTRAVENOUS

## 2018-07-11 MED ORDER — FENTANYL CITRATE (PF) 100 MCG/2ML IJ SOLN
INTRAMUSCULAR | Status: DC | PRN
  Administered 2018-07-11 (×2): 25 ug via INTRAVENOUS

## 2018-07-11 MED ORDER — METHOCARBAMOL 1000 MG/10ML IJ SOLN
500.0000 mg | Freq: Once | INTRAVENOUS | Status: AC
  Administered 2018-07-11: 500 mg via INTRAVENOUS
  Filled 2018-07-11 (×2): qty 5

## 2018-07-11 MED ORDER — OXYCODONE HCL 5 MG PO TABS
5.0000 mg | ORAL_TABLET | ORAL | Status: DC | PRN
  Administered 2018-07-12 – 2018-07-13 (×2): 5 mg via ORAL
  Filled 2018-07-11 (×2): qty 1

## 2018-07-11 SURGICAL SUPPLY — 37 items
BIT DRILL 4.3MMS DISTAL GRDTED (BIT) IMPLANT
CABLE CERLAGE W/CRIMP 1.8 (Cable) ×1 IMPLANT
CABLE CERLAGE W/CRIMP 1.8MM (Cable) ×1 IMPLANT
CANISTER SUCT 1200ML W/VALVE (MISCELLANEOUS) ×3 IMPLANT
CHLORAPREP W/TINT 26 (MISCELLANEOUS) ×3 IMPLANT
CORTICAL BONE SCR 5.0MM X 46MM (Screw) ×3 IMPLANT
COVER WAND RF STERILE (DRAPES) ×3 IMPLANT
DRAPE SHEET LG 3/4 BI-LAMINATE (DRAPES) ×3 IMPLANT
DRAPE U-SHAPE 47X51 STRL (DRAPES) ×3 IMPLANT
DRILL 4.3MMS DISTAL GRADUATED (BIT) ×3
DRSG OPSITE POSTOP 3X4 (GAUZE/BANDAGES/DRESSINGS) ×5 IMPLANT
DRSG OPSITE POSTOP 4X8 (GAUZE/BANDAGES/DRESSINGS) ×2 IMPLANT
GLOVE BIOGEL PI IND STRL 9 (GLOVE) ×1 IMPLANT
GLOVE BIOGEL PI INDICATOR 9 (GLOVE) ×2
GLOVE SURG SYN 9.0  PF PI (GLOVE) ×2
GLOVE SURG SYN 9.0 PF PI (GLOVE) ×1 IMPLANT
GOWN SRG 2XL LVL 4 RGLN SLV (GOWNS) ×1 IMPLANT
GOWN STRL NON-REIN 2XL LVL4 (GOWNS) ×2
GOWN STRL REUS W/ TWL LRG LVL3 (GOWN DISPOSABLE) ×1 IMPLANT
GOWN STRL REUS W/TWL LRG LVL3 (GOWN DISPOSABLE) ×2
GUIDEPIN VERSANAIL DSP 3.2X444 (ORTHOPEDIC DISPOSABLE SUPPLIES) ×2 IMPLANT
GUIDEWIRE BALL NOSE 80CM (WIRE) ×2 IMPLANT
KIT TURNOVER KIT A (KITS) ×3 IMPLANT
MAT ABSORB  FLUID 56X50 GRAY (MISCELLANEOUS) ×2
MAT ABSORB FLUID 56X50 GRAY (MISCELLANEOUS) ×1 IMPLANT
NAIL HIP FRAC RT 130 11MX400M (Nail) ×2 IMPLANT
NEEDLE FILTER BLUNT 18X 1/2SAF (NEEDLE) ×2
NEEDLE FILTER BLUNT 18X1 1/2 (NEEDLE) ×1 IMPLANT
NS IRRIG 500ML POUR BTL (IV SOLUTION) ×3 IMPLANT
PACK HIP COMPR (MISCELLANEOUS) ×3 IMPLANT
SCALPEL PROTECTED #15 DISP (BLADE) ×6 IMPLANT
SCREW CORTICL BON 5.0MM X 46MM (Screw) IMPLANT
SCREW LAG 10.5MMX105MM HFN (Screw) ×2 IMPLANT
STAPLER SKIN PROX 35W (STAPLE) ×3 IMPLANT
SUT VIC AB 1 CT1 36 (SUTURE) ×3 IMPLANT
SUT VIC AB 2-0 CT1 (SUTURE) ×3 IMPLANT
SYR 10ML LL (SYRINGE) ×3 IMPLANT

## 2018-07-11 NOTE — Transfer of Care (Signed)
Immediate Anesthesia Transfer of Care Note  Patient: Cindy Meyer  Procedure(s) Performed: INTRAMEDULLARY (IM) NAIL INTERTROCHANTRIC RIGHT LONG AFFIXUS (Right Hip)  Patient Location: PACU  Anesthesia Type:General  Level of Consciousness: awake, sedated and patient cooperative  Airway & Oxygen Therapy: Patient Spontanous Breathing  Post-op Assessment: Report given to RN and Post -op Vital signs reviewed and stable  Post vital signs: Reviewed and stable  Last Vitals:  Vitals Value Taken Time  BP 169/77 07/11/2018  6:18 PM  Temp 36.5 C 07/11/2018  6:18 PM  Pulse 80 07/11/2018  6:25 PM  Resp 14 07/11/2018  6:25 PM  SpO2 98 % 07/11/2018  6:25 PM  Vitals shown include unvalidated device data.  Last Pain:  Vitals:   07/11/18 1818  TempSrc:   PainSc: Asleep      Patients Stated Pain Goal: 1 (07/11/18 1405)  Complications: No apparent anesthesia complications

## 2018-07-11 NOTE — Progress Notes (Signed)
Advanced care plan. Purpose of the Encounter: CODE STATUS Parties in Attendance: Patient Patient's Decision Capacity: Good Subjective/Patient's story: Cindy Meyer  is a 85 y.o. female who presents with chief complaint as above.  Patient had a mechanical fall at home tonight and subsequent right hip pain.  She came to ED for evaluation and has a right hip fracture.  Orthopedic surgery contacted by ED physician and plans for operative repair.  Hospitalist called for admission Objective/Medical story Patient needs orthopedic surgery evaluation Needs ORIF procedure for fixation of the hip fracture Goals of care determination:  Advance care directives goals of care and treatment plan discussed Patient wants CPR, intubation ventilator if the need arises CODE STATUS: Full code Time spent discussing advanced care planning: 16 minutes

## 2018-07-11 NOTE — Anesthesia Procedure Notes (Signed)
Procedure Name: Intubation Date/Time: 07/11/2018 4:35 PM Performed by: Lowry Bowl, CRNA Pre-anesthesia Checklist: Patient identified, Emergency Drugs available, Suction available and Patient being monitored Patient Re-evaluated:Patient Re-evaluated prior to induction Oxygen Delivery Method: Circle system utilized Preoxygenation: Pre-oxygenation with 100% oxygen Induction Type: IV induction Ventilation: Mask ventilation without difficulty Laryngoscope Size: Mac and 3 Grade View: Grade I Tube type: Oral Tube size: 7.0 mm Number of attempts: 1 Airway Equipment and Method: Stylet Placement Confirmation: ETT inserted through vocal cords under direct vision,  positive ETCO2 and breath sounds checked- equal and bilateral Secured at: 21 cm Tube secured with: Tape Dental Injury: Teeth and Oropharynx as per pre-operative assessment

## 2018-07-11 NOTE — Op Note (Signed)
07/11/2018  6:17 PM  PATIENT:  Cindy Meyer  85 y.o. female  PRE-OPERATIVE DIAGNOSIS:  RIGHT HIP FRACTURE  POST-OPERATIVE DIAGNOSIS:  RIGHT HIP FRACTURE  PROCEDURE:  Procedure(s): INTRAMEDULLARY (IM) NAIL INTERTROCHANTRIC RIGHT LONG AFFIXUS (Right)  SURGEON: Leitha SchullerMichael J Nalla Purdy, MD  ASSISTANTS: none  ANESTHESIA:   general  EBL:  Total I/O In: 557.1 [I.V.:506.6; IV Piggyback:50.5] Out: 600 [Urine:300; Blood:300]  BLOOD ADMINISTERED:none  DRAINS: none   LOCAL MEDICATIONS USED:  NONE  SPECIMEN:  No Specimen  DISPOSITION OF SPECIMEN:  N/A  COUNTS:  YES  TOURNIQUET:  * No tourniquets in log *  IMPLANTS: Biomet affixes 13 x 380 mm rod right with 100 mm leg screw and 42 mm interlocking screw  DICTATION: .Dragon Dictation  patient was brought to the operating room and after adequate general anesthesia was obtained she was placed on the operative table. The left leg was in the well-leg holder and t foot in the traction boot w and mini plus rightith slight traction applied. C arm was brought in and with good visualization anatomic alignment was obtained in the AP view with the proximal fragment flexed and the lateral view. The hip was then prepped and draped using the barrier drape method and appropriate patient identification and timeout procedures were completed. Initially the fracture was additionally reduced by a lateral incision at the level of the spike of the anterior fragment and the subcu tissue and muscle was split such that the clamp from the affixes that could be placed around the fracture and help hold the flexed spike and partial reduction on the lateral view.  A proximal incision was made and a guidewire was inserted into the tip of the trochanter in the center position on the lateral view. This was then inserted down the canal part way and proximal reaming carried out. The long guidewire was then inserted and measurement made off of this for rod length determination  reaming was carried out to 13 millimeters and 11 x 380 rod was inserted down the canal with adjustments to the depth to get these sent the screw and the center center position this was done using the drill guide through the 130 degree arm on the implant with a small lateral incision getting the drill sleeve down to the bone placing the guidewire then drilling tapping and placing the 100 mm screw. Traction was released at this point and no compression applied with this being a fixed angle device secondary to it being a sub-troches fracture. The guidewire was then removed along with the insertion handle with after tightening down the proximal screw. The good view of the distal oblique screw hole was obtained drilling measuring and placing the 46 mm screw was done with permanent C arm views AP lateral of the proximal femur and an AP view distally showing appropriate screw length. With the proximal fragment flexed and the leg screw essentially going through the fracture site I did not feel that the proximal fixation was adequate and the proximal fragment might migrate into more flexion and have loss of reduction.  Because of this the middle incision was extended to allow for the passage of a wire which was somewhat difficult but the wire was passed around the anterior specular proximal fragment and posterior to the the distal fragment and well visualized on both AP and lateral imaging this was tensioned to prevent further motion of the fracture fragments and when the appropriate tension had been obtained the screw to tighten the wire was  tightened down and excess wire cut.  The wounds were irrigated and closed with #1 Vicryl for deep fascia 2-0 Vicryl subcutaneously and skin staples followed by honeycomb dressing  PLAN OF CARE: Continue as inpatient  PATIENT DISPOSITION:  PACU - hemodynamically stable.

## 2018-07-11 NOTE — ED Notes (Signed)
ED TO INPATIENT HANDOFF REPORT  ED Nurse Name and Phone #:  [phone]  S Name/Age/Gender Cindy Meyer 85 y.o. female Room/Bed: ED02A/ED02A  Code Status   Code Status: Full Code  Home/SNF/Other Home Patient oriented to: self Is this baseline? Yes   Triage Complete: Triage complete  Chief Complaint Fall  Triage Note Reports tripped over family members shoe, fell into the washing machine then to the floor.  Patient denies hitting head or loss of consciousness.     Allergies No Known Allergies  Level of Care/Admitting Diagnosis ED Disposition    ED Disposition Condition Comment   Admit  [facility] [[id number]]  Level of Care: Med-Surg [16]  Covid Evaluation: Screening Protocol (No Symptoms)  Diagnosis: Hip fracture [facility](HCC) [[id number]]) [[id number]]  Admitting Physician: Oralia ManisWILLIS, DAVID [[id number]]  Attending Physician: Oralia ManisWILLIS, DAVID [phone][[id number]]  Estimated length of stay: past midnight tomorrow  Certification:: I certify this patient will need inpatient services for at least 2 midnights  PT Class (Do Not Modify): Inpatient [101]  PT Acc Code (Do Not Modify): Private [1]       B Medical/Surgery History Past Medical History:  Diagnosis Date  . Hypertension    Past Surgical History:  Procedure Laterality Date  . HERNIA REPAIR       A IV Location/Drains/Wounds Patient Lines/Drains/Airways Status   Active Line/Drains/Airways    Name:   Placement date:   Placement time:   Site:   Days:   Peripheral IV 07/10/18 Left Forearm   07/10/18    2100    Forearm   1   Peripheral IV 07/11/18 Right Forearm   07/11/18    0412    Forearm   less than 1          Intake/Output Last 24 hours No intake or output data in the 24 hours ending 07/11/18 0438  Labs/Imaging Results for orders placed or performed during the hospital encounter of 07/10/18 (from the past 48 hour(s))  CBC with Differential/Platelet     Status: None   Collection Time: 07/10/18 10:51 PM   Result Value Ref Range   WBC 7.8 4.0 - 10.5 K/uL   RBC 4.25 3.87 - 5.11 MIL/uL   Hemoglobin 12.0 12.0 - 15.0 g/dL   HCT 40.338.8 47.436.0 - 25.946.0 %   MCV 91.3 80.0 - 100.0 fL   MCH 28.2 26.0 - 34.0 pg   MCHC 30.9 30.0 - 36.0 g/dL   RDW 56.313.4 87.511.5 - 64.315.5 %   Platelets 182 150 - 400 K/uL   nRBC 0.0 0.0 - 0.2 %   Neutrophils Relative % 53 %   Neutro Abs 4.1 1.7 - 7.7 K/uL   Lymphocytes Relative 35 %   Lymphs Abs 2.7 0.7 - 4.0 K/uL   Monocytes Relative 9 %   Monocytes Absolute 0.7 0.1 - 1.0 K/uL   Eosinophils Relative 3 %   Eosinophils Absolute 0.2 0.0 - 0.5 K/uL   Basophils Relative 0 %   Basophils Absolute 0.0 0.0 - 0.1 K/uL   Immature Granulocytes 0 %   Abs Immature Granulocytes 0.02 0.00 - 0.07 K/uL    Comment: Performed at [facility], [address] [place], [place] [address]  Comprehensive metabolic panel     Status: Abnormal   Collection Time: 07/10/18 10:51 PM  Result Value Ref Range   Sodium 140 135 - 145 mmol/L   Potassium 3.2 (L) 3.5 - 5.1 mmol/L   Chloride 104 98 - 111 mmol/L  CO2 27 22 - 32 mmol/L   Glucose, Bld 132 (H) 70 - 99 mg/dL   BUN 28 (H) 8 - 23 mg/dL   Creatinine, Ser 4.09 (H) 0.44 - 1.00 mg/dL   Calcium 9.4 8.9 - 81.1 mg/dL   Total Protein 8.0 6.5 - 8.1 g/dL   Albumin 3.8 3.5 - 5.0 g/dL   AST 59 (H) 15 - 41 U/L   ALT 13 0 - 44 U/L   Alkaline Phosphatase 62 38 - 126 U/L   Total Bilirubin 0.6 0.3 - 1.2 mg/dL   GFR calc non Af Amer 48 (L) >60 mL/min   GFR calc Af Amer 56 (L) >60 mL/min   Anion gap 9 5 - 15    Comment: Performed at [facility], [address] [place], [place] [address]  SARS Coronavirus 2 (CEPHEID - Performed in [facility] hospital lab), Hosp Order     Status: None   Collection Time: 07/10/18 11:33 PM  Result Value Ref Range   SARS Coronavirus 2 NEGATIVE NEGATIVE    Comment: (NOTE) If result is NEGATIVE SARS-CoV-2 target nucleic acids are NOT DETECTED. The SARS-CoV-2 RNA is generally detectable in upper and lower   respiratory specimens during the acute phase of infection. The lowest  concentration of SARS-CoV-2 viral copies this assay can detect is 250  copies / mL. A negative result does not preclude SARS-CoV-2 infection  and should not be used as the sole basis for treatment or other  patient management decisions.  A negative result may occur with  improper specimen collection / handling, submission of specimen other  than nasopharyngeal swab, presence of viral mutation(s) within the  areas targeted by this assay, and inadequate number of viral copies  (<250 copies / mL). A negative result must be combined with clinical  observations, patient history, and epidemiological information. If result is POSITIVE SARS-CoV-2 target nucleic acids are DETECTED. The SARS-CoV-2 RNA is generally detectable in upper and lower  respiratory specimens dur ing the acute phase of infection.  Positive  results are indicative of active infection with SARS-CoV-2.  Clinical  correlation with patient history and other diagnostic information is  necessary to determine patient infection status.  Positive results do  not rule out bacterial infection or co-infection with other viruses. If result is PRESUMPTIVE POSTIVE SARS-CoV-2 nucleic acids MAY BE PRESENT.   A presumptive positive result was obtained on the submitted specimen  and confirmed on repeat testing.  While 2019 novel coronavirus  (SARS-CoV-2) nucleic acids may be present in the submitted sample  additional confirmatory testing may be necessary for epidemiological  and / or clinical management purposes  to differentiate between  SARS-CoV-2 and other Sarbecovirus currently known to infect humans.  If clinically indicated additional testing with an alternate test  methodology [phone]) is advised. The SARS-CoV-2 RNA is generally  detectable in upper and lower respiratory sp ecimens during the acute  phase of infection. The expected result is Negative. Fact  Sheet for Patients:  BoilerBrush.com.cy Fact Sheet for Healthcare Providers: https://pope.com/ This test is not yet approved or cleared by the [place] FDA and has been authorized for detection and/or diagnosis of SARS-CoV-2 by FDA under an Emergency Use Authorization (EUA).  This EUA will remain in effect (meaning this test can be used) for the duration of the COVID-19 declaration under Section 564(b)(1) of the Act, 21 U.S.C. section 360bbb-3(b)(1), unless the authorization is terminated or revoked sooner. Performed at [facility], [address]  [place], [place] [address]    Dg Chest 1 View  Result Date: 07/10/2018 CLINICAL DATA:  Fall with right hip fracture EXAM: CHEST  1 VIEW COMPARISON:  None. FINDINGS: The lungs are hyperinflated with diffuse interstitial prominence. No focal airspace consolidation or pulmonary edema. No pleural effusion or pneumothorax. Normal cardiomediastinal contours. IMPRESSION: Hyperinflation without focal airspace disease. Electronically Signed   By: Deatra Robinson M.D.   On: 07/10/2018 23:26   Dg Hip Unilat W Or Wo Pelvis 2-3 Views Right  Result Date: 07/10/2018 CLINICAL DATA:  Hip pain after falling EXAM: DG HIP (WITH OR WITHOUT PELVIS) 2-3V RIGHT COMPARISON:  None. FINDINGS: Oblique fracture of the proximal right femur extending distally and laterally from the lesser trochanter. Moderate medial angulation. No dislocation. Small fracture fragment medially displaced from the lesser trochanter. IMPRESSION: Predominantly oblique fracture of the proximal right femur extending distally from the lesser trochanter. Electronically Signed   By: Deatra Robinson M.D.   On: 07/10/2018 23:25    Pending Labs Unresulted Labs (From admission, onward)    Start     Ordered   07/11/18 0500  Basic metabolic panel  Tomorrow morning,   STAT     07/11/18 0334   07/11/18 0500  CBC  Tomorrow morning,   STAT     07/11/18  0334   07/11/18 0258  Type and screen Hold 2 units now and stay ahead 2 units at all times  Once,   STAT    Comments:  Hold 2 units now and stay ahead 2 units at all times    07/11/18 0259          Vitals/Pain Today's Vitals   07/11/18 0230 07/11/18 0300 07/11/18 0330 07/11/18 0400  BP: (!) 153/105 136/82 (!) 178/95 120/77  Pulse: (!) 114 90 (!) 101 99  Resp: (!) 29 16 (!) 24 15  Temp:      TempSrc:      SpO2: 94% 100% 100% 98%  Weight:      Height:      PainSc:        Isolation Precautions No active isolations  Medications Medications  ceFAZolin (ANCEF) IVPB 1 g/50 mL premix (has no administration in time range)  acetaminophen (TYLENOL) tablet 650 mg (has no administration in time range)    Or  acetaminophen (TYLENOL) suppository 650 mg (has no administration in time range)  oxyCODONE (Oxy IR/ROXICODONE) immediate release tablet 5 mg (has no administration in time range)  ondansetron (ZOFRAN) tablet 4 mg (has no administration in time range)    Or  ondansetron (ZOFRAN) injection 4 mg (has no administration in time range)  morphine 2 MG/ML injection 2 mg (2 mg Intravenous Given 07/11/18 0342)  methocarbamol (ROBAXIN) 500 mg in dextrose 5 % 50 mL IVPB (has no administration in time range)  pantoprazole (PROTONIX) 80 mg in sodium chloride 0.9 % 100 mL IVPB (80 mg Intravenous New Bag/Given 07/11/18 0421)  pantoprazole (PROTONIX) 80 mg in sodium chloride 0.9 % 250 mL (0.32 mg/mL) infusion (has no administration in time range)  pantoprazole (PROTONIX) injection 40 mg (has no administration in time range)  ondansetron (ZOFRAN) injection 4 mg (4 mg Intravenous Given 07/11/18 0342)    Mobility walks High fall risk   Focused Assessments Cardiac Assessment Handoff:    No results found for: CKTOTAL, CKMB, CKMBINDEX, TROPONINI No results found for: DDIMER Does the Patient currently have chest pain? No      R Recommendations: See Admitting Provider Note  Report given  to:    Additional Notes:

## 2018-07-11 NOTE — ED Notes (Signed)
ED TO INPATIENT HANDOFF REPORT  ED Nurse Name and Phone #:  [phone]  S Name/Age/Gender Cindy Meyer 85 y.o. female Room/Bed: ED02A/ED02A  Code Status   Code Status: Not on file  Home/SNF/Other Home Patient oriented to: self Is this baseline? Yes   Triage Complete: Triage complete  Chief Complaint Fall  Triage Note Reports tripped over family members shoe, fell into the washing machine then to the floor.  Patient denies hitting head or loss of consciousness.     Allergies No Known Allergies  Level of Care/Admitting Diagnosis ED Disposition    ED Disposition Condition Comment   Admit  [facility] [[id number]]  Level of Care: Med-Surg [16]  Covid Evaluation: Screening Protocol (No Symptoms)  Diagnosis: Hip fracture [facility](HCC) [[id number]]) [197979]  Admitting Physician: Oralia ManisWILLIS, DAVID [[id number]]  Attending Physician: Oralia ManisWILLIS, DAVID [phone][[id number]]  Estimated length of stay: past midnight tomorrow  Certification:: I certify this patient will need inpatient services for at least 2 midnights  PT Class (Do Not Modify): Inpatient [101]  PT Acc Code (Do Not Modify): Private [1]       B Medical/Surgery History Past Medical History:  Diagnosis Date  . Hypertension    Past Surgical History:  Procedure Laterality Date  . HERNIA REPAIR       A IV Location/Drains/Wounds Patient Lines/Drains/Airways Status   Active Line/Drains/Airways    None          Intake/Output Last 24 hours No intake or output data in the 24 hours ending 07/11/18 0219  Labs/Imaging Results for orders placed or performed during the hospital encounter of 07/10/18 (from the past 48 hour(s))  CBC with Differential/Platelet     Status: None   Collection Time: 07/10/18 10:51 PM  Result Value Ref Range   WBC 7.8 4.0 - 10.5 K/uL   RBC 4.25 3.87 - 5.11 MIL/uL   Hemoglobin 12.0 12.0 - 15.0 g/dL   HCT 08.638.8 57.836.0 - 46.946.0 %   MCV 91.3 80.0 - 100.0 fL   MCH 28.2 26.0 - 34.0 pg   MCHC  30.9 30.0 - 36.0 g/dL   RDW 62.913.4 52.811.5 - 41.315.5 %   Platelets 182 150 - 400 K/uL   nRBC 0.0 0.0 - 0.2 %   Neutrophils Relative % 53 %   Neutro Abs 4.1 1.7 - 7.7 K/uL   Lymphocytes Relative 35 %   Lymphs Abs 2.7 0.7 - 4.0 K/uL   Monocytes Relative 9 %   Monocytes Absolute 0.7 0.1 - 1.0 K/uL   Eosinophils Relative 3 %   Eosinophils Absolute 0.2 0.0 - 0.5 K/uL   Basophils Relative 0 %   Basophils Absolute 0.0 0.0 - 0.1 K/uL   Immature Granulocytes 0 %   Abs Immature Granulocytes 0.02 0.00 - 0.07 K/uL    Comment: Performed at [facility], [address] [place], [place] [address]  Comprehensive metabolic panel     Status: Abnormal   Collection Time: 07/10/18 10:51 PM  Result Value Ref Range   Sodium 140 135 - 145 mmol/L   Potassium 3.2 (L) 3.5 - 5.1 mmol/L   Chloride 104 98 - 111 mmol/L   CO2 27 22 - 32 mmol/L   Glucose, Bld 132 (H) 70 - 99 mg/dL   BUN 28 (H) 8 - 23 mg/dL   Creatinine, Ser 0.271.03 (H) 0.44 - 1.00 mg/dL   Calcium 9.4 8.9 - 25.310.3 mg/dL   Total Protein 8.0 6.5 - 8.1 g/dL   Albumin 3.8 3.5 -  5.0 g/dL   AST 59 (H) 15 - 41 U/L   ALT 13 0 - 44 U/L   Alkaline Phosphatase 62 38 - 126 U/L   Total Bilirubin 0.6 0.3 - 1.2 mg/dL   GFR calc non Af Amer 48 (L) >60 mL/min   GFR calc Af Amer 56 (L) >60 mL/min   Anion gap 9 5 - 15    Comment: Performed at [facility], [address], [place], [place] [address]  SARS Coronavirus 2 (CEPHEID - Performed in [facility] hospital lab), Hosp Order     Status: None   Collection Time: 07/10/18 11:33 PM  Result Value Ref Range   SARS Coronavirus 2 NEGATIVE NEGATIVE    Comment: (NOTE) If result is NEGATIVE SARS-CoV-2 target nucleic acids are NOT DETECTED. The SARS-CoV-2 RNA is generally detectable in upper and lower  respiratory specimens during the acute phase of infection. The lowest  concentration of SARS-CoV-2 viral copies this assay can detect is 250  copies / mL. A negative result does not preclude SARS-CoV-2  infection  and should not be used as the sole basis for treatment or other  patient management decisions.  A negative result may occur with  improper specimen collection / handling, submission of specimen other  than nasopharyngeal swab, presence of viral mutation(s) within the  areas targeted by this assay, and inadequate number of viral copies  (<250 copies / mL). A negative result must be combined with clinical  observations, patient history, and epidemiological information. If result is POSITIVE SARS-CoV-2 target nucleic acids are DETECTED. The SARS-CoV-2 RNA is generally detectable in upper and lower  respiratory specimens dur ing the acute phase of infection.  Positive  results are indicative of active infection with SARS-CoV-2.  Clinical  correlation with patient history and other diagnostic information is  necessary to determine patient infection status.  Positive results do  not rule out bacterial infection or co-infection with other viruses. If result is PRESUMPTIVE POSTIVE SARS-CoV-2 nucleic acids MAY BE PRESENT.   A presumptive positive result was obtained on the submitted specimen  and confirmed on repeat testing.  While 2019 novel coronavirus  (SARS-CoV-2) nucleic acids may be present in the submitted sample  additional confirmatory testing may be necessary for epidemiological  and / or clinical management purposes  to differentiate between  SARS-CoV-2 and other Sarbecovirus currently known to infect humans.  If clinically indicated additional testing with an alternate test  methodology [phone]) is advised. The SARS-CoV-2 RNA is generally  detectable in upper and lower respiratory sp ecimens during the acute  phase of infection. The expected result is Negative. Fact Sheet for Patients:  BoilerBrush.com.cy Fact Sheet for Healthcare Providers: https://pope.com/ This test is not yet approved or cleared by the [place]  FDA and has been authorized for detection and/or diagnosis of SARS-CoV-2 by FDA under an Emergency Use Authorization (EUA).  This EUA will remain in effect (meaning this test can be used) for the duration of the COVID-19 declaration under Section 564(b)(1) of the Act, 21 U.S.C. section 360bbb-3(b)(1), unless the authorization is terminated or revoked sooner. Performed at [facility], [address] [place], [place] [address]    Dg Chest 1 View  Result Date: 07/10/2018 CLINICAL DATA:  Fall with right hip fracture EXAM: CHEST  1 VIEW COMPARISON:  None. FINDINGS: The lungs are hyperinflated with diffuse interstitial prominence. No focal airspace consolidation or pulmonary edema. No pleural effusion or pneumothorax. Normal cardiomediastinal contours. IMPRESSION: Hyperinflation without focal  airspace disease. Electronically Signed   By: Deatra Robinson M.D.   On: 07/10/2018 23:26   Dg Hip Unilat W Or Wo Pelvis 2-3 Views Right  Result Date: 07/10/2018 CLINICAL DATA:  Hip pain after falling EXAM: DG HIP (WITH OR WITHOUT PELVIS) 2-3V RIGHT COMPARISON:  None. FINDINGS: Oblique fracture of the proximal right femur extending distally and laterally from the lesser trochanter. Moderate medial angulation. No dislocation. Small fracture fragment medially displaced from the lesser trochanter. IMPRESSION: Predominantly oblique fracture of the proximal right femur extending distally from the lesser trochanter. Electronically Signed   By: Deatra Robinson M.D.   On: 07/10/2018 23:25    Pending Labs [employer] (From admission, onward)    Start     Ordered   Signed and Armed forces training and education officer morning,   R     Signed and Held   Signed and Held  CBC  Tomorrow morning,   R     Signed and Held          Vitals/Pain Today's Vitals   07/10/18 2249 07/11/18 0056 07/11/18 0056 07/11/18 0056  BP:    132/80  Pulse: 94   87  Resp: 17   19  Temp: 98.6 F (37 C)     TempSrc: Oral      SpO2: 98%   100%  Weight:      Height:      PainSc:  0-No pain 1      Isolation Precautions No active isolations  Medications Medications  fentaNYL (SUBLIMAZE) injection 12.5 mcg (12.5 mcg Intravenous Given 07/10/18 2340)  ceFAZolin (ANCEF) IVPB 1 g/50 mL premix (has no administration in time range)  methocarbamol (ROBAXIN) 500 mg in dextrose 5 % 50 mL IVPB (has no administration in time range)    Mobility walks High fall risk   Focused Assessments Cardiac Assessment Handoff:    No results found for: CKTOTAL, CKMB, CKMBINDEX, TROPONINI No results found for: DDIMER Does the Patient currently have chest pain? No      R Recommendations: See Admitting Provider Note  Report given to:   Additional Notes:

## 2018-07-11 NOTE — H&P (Signed)
[facility] - [place] at [facility]   PATIENT NAME: Cindy Meyer    MR#:  [id number]  DATE OF BIRTH:  [birth year]  DATE OF ADMISSION:  07/10/2018  PRIMARY CARE PHYSICIAN: Barbette Reichmann, MD   REQUESTING/REFERRING PHYSICIAN: Roxan Hockey, MD  CHIEF COMPLAINT:   Chief Complaint  Patient presents with  . Fall  . Hip Pain    HISTORY OF PRESENT ILLNESS:  Cindy Meyer  is a 85 y.o. female who presents with chief complaint as above.  Patient had a mechanical fall at home tonight and subsequent right hip pain.  Cindy Meyer came to ED for evaluation and has a right hip fracture.  Orthopedic surgery contacted by ED physician and plans for operative repair.  Hospitalist called for admission  PAST MEDICAL HISTORY:   Past Medical History:  Diagnosis Date  . Hypertension      PAST SURGICAL HISTORY:   Past Surgical History:  Procedure Laterality Date  . HERNIA REPAIR       SOCIAL HISTORY:   Social History   Tobacco Use  . Smoking status: Never Smoker  . Smokeless tobacco: Never Used  Substance Use Topics  . Alcohol use: No     FAMILY HISTORY:    Family history reviewed and is non-contributory DRUG ALLERGIES:  No Known Allergies  MEDICATIONS AT HOME:   Prior to Admission medications   Medication Sig Start Date End Date Taking? Authorizing Provider  losartan-hydrochlorothiazide (HYZAAR) 50-12.5 MG tablet Take 1 tablet by mouth daily.    [provider]  Multiple Vitamin (MULTIVITAMIN WITH MINERALS) TABS tablet Take 1 tablet by mouth daily.    [provider]    REVIEW OF SYSTEMS:  Review of Systems  Constitutional: Negative for chills, fever, malaise/fatigue and weight loss.  HENT: Negative for ear pain, hearing loss and tinnitus.   Eyes: Negative for blurred vision, double vision, pain and redness.  Respiratory: Negative for cough, hemoptysis and shortness of breath.   Cardiovascular: Negative for chest pain, palpitations,  orthopnea and leg swelling.  Gastrointestinal: Negative for abdominal pain, constipation, diarrhea, nausea and vomiting.  Genitourinary: Negative for dysuria, frequency and hematuria.  Musculoskeletal: Positive for joint pain (Right hip). Negative for back pain and neck pain.  Skin:       No acne, rash, or lesions  Neurological: Negative for dizziness, tremors, focal weakness and weakness.  Endo/Heme/Allergies: Negative for polydipsia. Does not bruise/bleed easily.  Psychiatric/Behavioral: Negative for depression. The patient is not nervous/anxious and does not have insomnia.      VITAL SIGNS:   Vitals:   07/10/18 2246 07/10/18 2249 07/11/18 0056  BP:   132/80  Pulse:  94 87  Resp:  17 19  Temp:  98.6 F (37 C)   TempSrc:  Oral   SpO2:  98% 100%  Weight: 49.9 kg    Height:  (1.676 m)     Wt Readings from Last 3 Encounters:  07/10/18 49.9 kg  08/19/17 49.9 kg    PHYSICAL EXAMINATION:  Physical Exam  Vitals reviewed. Constitutional: Cindy Meyer is oriented to person, place, and time. Cindy Meyer appears well-developed and well-nourished. No distress.  HENT:  Head: Normocephalic and atraumatic.  Mouth/Throat: Oropharynx is clear and moist.  Eyes: Pupils are equal, round, and reactive to light. Conjunctivae and EOM are normal. No scleral icterus.  Neck: Normal range of motion. Neck supple. No JVD present. No thyromegaly present.  Cardiovascular: Normal rate, regular rhythm and intact distal pulses. Exam reveals no gallop and no  friction rub.  No murmur heard. Respiratory: Effort normal and breath sounds normal. No respiratory distress. Cindy Meyer has no wheezes. Cindy Meyer has no rales.  GI: Soft. Bowel sounds are normal. Cindy Meyer exhibits no distension. There is no abdominal tenderness.  Musculoskeletal: Normal range of motion.        General: Tenderness (Right hip) and deformity (Right hip shortened and externally rotated) present. No edema.     Comments: No arthritis, no gout  Lymphadenopathy:    Cindy Meyer  has no cervical adenopathy.  Neurological: Cindy Meyer is alert and oriented to person, place, and time. No cranial nerve deficit.  No dysarthria, no aphasia  Skin: Skin is warm and dry. No rash noted. No erythema.  Psychiatric: Cindy Meyer has a normal mood and affect. Her behavior is normal. Judgment and thought content normal.    LABORATORY PANEL:   CBC Recent Labs  Lab 07/10/18 2251  WBC 7.8  HGB 12.0  HCT 38.8  PLT 182   ------------------------------------------------------------------------------------------------------------------  Chemistries  Recent Labs  Lab 07/10/18 2251  NA 140  K 3.2*  CL 104  CO2 27  GLUCOSE 132*  BUN 28*  CREATININE 1.03*  CALCIUM 9.4  AST 59*  ALT 13  ALKPHOS 62  BILITOT 0.6   ------------------------------------------------------------------------------------------------------------------  Cardiac Enzymes No results for input(s): TROPONINI in the last 168 hours. ------------------------------------------------------------------------------------------------------------------  RADIOLOGY:  Dg Chest 1 View  Result Date: 07/10/2018 CLINICAL DATA:  Fall with right hip fracture EXAM: CHEST  1 VIEW COMPARISON:  None. FINDINGS: The lungs are hyperinflated with diffuse interstitial prominence. No focal airspace consolidation or pulmonary edema. No pleural effusion or pneumothorax. Normal cardiomediastinal contours. IMPRESSION: Hyperinflation without focal airspace disease. Electronically Signed   By: Deatra Robinson M.D.   On: 07/10/2018 23:26   Dg Hip Unilat W Or Wo Pelvis 2-3 Views Right  Result Date: 07/10/2018 CLINICAL DATA:  Hip pain after falling EXAM: DG HIP (WITH OR WITHOUT PELVIS) 2-3V RIGHT COMPARISON:  None. FINDINGS: Oblique fracture of the proximal right femur extending distally and laterally from the lesser trochanter. Moderate medial angulation. No dislocation. Small fracture fragment medially displaced from the lesser trochanter. IMPRESSION:  Predominantly oblique fracture of the proximal right femur extending distally from the lesser trochanter. Electronically Signed   By: Deatra Robinson M.D.   On: 07/10/2018 23:25    EKG:  No orders found for this or any previous visit.  IMPRESSION AND PLAN:  Principal Problem:   Hip fracture (HCC) -orthopedic surgery is aware of the patient, defer to their treatment plan for this problem, PRN analgesia tonight Active Problems:   HTN (hypertension) -home dose antihypertensives  Chart review performed and case discussed with ED provider. Labs, imaging and/or ECG reviewed by provider and discussed with patient/family. Management plans discussed with the patient and/or family.  COVID-19 status: Tested negative     DVT PROPHYLAXIS: Mechanical only  GI PROPHYLAXIS:  None  ADMISSION STATUS: Inpatient     CODE STATUS: Full  TOTAL TIME TAKING CARE OF THIS PATIENT: 45 minutes.   This patient was evaluated in the context of the global COVID-19 pandemic, which necessitated consideration that the patient might be at risk for infection with the SARS-CoV-2 virus that causes COVID-19. Institutional protocols and algorithms that pertain to the evaluation of patients at risk for COVID-19 are in a state of rapid change based on information released by regulatory bodies including the CDC and federal and state organizations. These policies and algorithms were followed to the best of this provider's knowledge  to date during the patient's care at this facility.  Barney DrainDavid F Koua Deeg 07/11/2018, 1:18 AM  [facility]  Office  [phone]  CC: Primary care physician; Barbette ReichmannHande, Vishwanath, MD  Note:  This document was prepared using Dragon voice recognition software and may include unintentional dictation errors.

## 2018-07-11 NOTE — ED Notes (Signed)
Updated patient's granddaughter Cindy Meyer [phone]) regarding patient's condition and pending admission.

## 2018-07-11 NOTE — ED Notes (Signed)
Report called to the floor.

## 2018-07-11 NOTE — Progress Notes (Signed)
Pt off floor to OR

## 2018-07-11 NOTE — ED Notes (Signed)
Called telephone number listed in chart to update family, no answer.

## 2018-07-11 NOTE — Progress Notes (Signed)
Initial Nutrition Assessment  RD working remotely.  DOCUMENTATION CODES:   Underweight  INTERVENTION:  Once diet advanced, provide Ensure Enlive po BID, each supplement provides 350 kcal and 20 grams of protein.  Also provide daily MVI.  NUTRITION DIAGNOSIS:   Increased nutrient needs related to post-op healing as evidenced by estimated needs.  GOAL:   Patient will meet greater than or equal to 90% of their needs  MONITOR:   PO intake, Supplement acceptance, Diet advancement, Labs, Weight trends, Skin, I & O's  REASON FOR ASSESSMENT:   Other (Comment)(Low BMI)    ASSESSMENT:   85 year old female with PMHx of HTN admitted after mechanical fall found to have right hip fracture and possible GI bleed.   Patient down in OR so unable to obtain any nutrition/weight history at this time. Will monitor for diet advancement following surgery. There is a pending GI consult in setting of hematemesis. No weight history in chart to trend. Patient is underweight. She is at risk for malnutrition.  Medications reviewed and include: pantoprazole.  Labs reviewed: BUN 26.  NUTRITION - FOCUSED PHYSICAL EXAM:  Unable to complete at this time.  Diet Order:   Diet Order            Diet NPO time specified  Diet effective now             EDUCATION NEEDS:   No education needs have been identified at this time  Skin:  Skin Assessment: Reviewed RN Assessment  Last BM:  07/10/2018 per chart  Height:   Ht Readings from Last 1 Encounters:  07/10/18 5\' 6"  (1.676 m)   Weight:   Wt Readings from Last 1 Encounters:  07/11/18 49.9 kg   Ideal Body Weight:  59.1 kg  BMI:  Body mass index is 17.75 kg/m.  Estimated Nutritional Needs:   Kcal:  1300-1500  Protein:  65-75 grams  Fluid:  1.3-1.5 L/day  Helane Rima, MS, RD, LDN Office: [phone] Pager: [phone] After Hours/Weekend Pager: [phone]

## 2018-07-11 NOTE — ED Notes (Signed)
Patient called out to say she was sick on her stomach. Assisted patient to turn her head and held emesis basin for her. Patient had emesis of bright red blood approximated at 1/3 cup. Admitting MD paged so he can be made aware.

## 2018-07-11 NOTE — Consult Note (Signed)
Patient suffered a fall at home last night and came to emergency room with subtrochanteric hip fracture for which she is admitted.  She normally is an independent ambulator and takes care of her self.  No prodromal symptoms  On exam the right leg is flexed externally rotated and shortened.  She is able to flex extend the toes and has palpable pulses.  Skin is intact  Radiographs reveal a oblique subtrochanteric hip fracture with reverse obliquity  Impression is difficult sub-troches fracture that is usually slow to heal  Plan is for ORIF with IM rod possible cerclage wire to help maintain anatomic alignment.  This was discussed with the patient will plan on doing that later today when or time is available

## 2018-07-11 NOTE — Anesthesia Preprocedure Evaluation (Signed)
Anesthesia Evaluation  Patient identified by MRN, date of birth, ID band Patient awake    Reviewed: Allergy & Precautions, H&P , NPO status , Patient's Chart, lab work & pertinent test results, reviewed documented beta blocker date and time   Airway Mallampati: II  TM Distance: >3 FB Neck ROM: full    Dental  (+) Edentulous Upper, Edentulous Lower   Pulmonary neg pulmonary ROS,    Pulmonary exam normal        Cardiovascular Exercise Tolerance: Poor hypertension, On Medications negative cardio ROS Normal cardiovascular exam Rhythm:regular Rate:Normal     Neuro/Psych negative neurological ROS  negative psych ROS   GI/Hepatic negative GI ROS, Neg liver ROS,   Endo/Other  negative endocrine ROS  Renal/GU negative Renal ROS  negative genitourinary   Musculoskeletal   Abdominal   Peds  Hematology negative hematology ROS (+)   Anesthesia Other Findings Past Medical History: No date: Hypertension Past Surgical History: No date: HERNIA REPAIR BMI    Body Mass Index:  17.75 kg/m     Reproductive/Obstetrics negative OB ROS                             Anesthesia Physical Anesthesia Plan  ASA: III and emergent  Anesthesia Plan: General ETT   Post-op Pain Management:    Induction:   PONV Risk Score and Plan:   Airway Management Planned:   Additional Equipment:   Intra-op Plan:   Post-operative Plan:   Informed Consent: I have reviewed the patients History and Physical, chart, labs and discussed the procedure including the risks, benefits and alternatives for the proposed anesthesia with the patient or authorized representative who has indicated his/her understanding and acceptance.     Dental Advisory Given  Plan Discussed with: CRNA  Anesthesia Plan Comments: (Risks of GA and RA discussed with patient.  She prefers to proceed with GOT.  JA)        Anesthesia Quick  Evaluation

## 2018-07-11 NOTE — TOC Benefit Eligibility Note (Signed)
Transition of Care [facility]) Benefit Eligibility Note    Patient Details  Name: Cindy Meyer MRN: [id number] Date of Birth: 04/21/1926   Medication/Dose: Lovenox 40mg  once daily for 14 days  Covered?: No   Prescription Coverage Preferred Pharmacy: Any retail pharmacy.  [facility], [facility], and [facility] all within network.    Spoke with Person/Company/Phone Number:: Cindy Meyer with [employer] at [phone]  Co-Pay: If approved, estimated copay for name brand would be $3.90 due to subsidy on plan (more details in additional notes section).   Prior Approval: Yes(PA required fot name brand: [phone].)  Deductible: Unmet($435 deductible on plan, unmet as of time of call.)  Additional Notes: Generic Enoxaparin covered with no PA required. Considered Tier 4.  Estimated copay $1.30.  Per rep, patient has a category 2 low income subsidy (LIS) on plan.  All formulary generics are $1.30, name brand medications $3.90 copay.  Deductible may not apply with LIS Category 2.    Cindy Meyer Phone Number: [phone] or [phone] 07/11/2018, 4:20 PM

## 2018-07-11 NOTE — Progress Notes (Signed)
[facility] - [place] at [facility]   PATIENT NAME: Cindy Meyer    MR#:  [id number]  DATE OF BIRTH:  [birth year]  SUBJECTIVE:  CHIEF COMPLAINT:   Chief Complaint  Patient presents with  . Fall  . Hip Pain  Patient seen and evaluated today Has pain in the right hip No new episodes of vomiting of blood No rectal bleed  REVIEW OF SYSTEMS:    ROS  CONSTITUTIONAL: No documented fever. No fatigue, weakness. No weight gain, no weight loss.  EYES: No blurry or double vision.  ENT: No tinnitus. No postnasal drip. No redness of the oropharynx.  RESPIRATORY: No cough, no wheeze, no hemoptysis. No dyspnea.  CARDIOVASCULAR: No chest pain. No orthopnea. No palpitations. No syncope.  GASTROINTESTINAL: No nausea, no vomiting or diarrhea. No abdominal pain. No melena or hematochezia.  GENITOURINARY: No dysuria or hematuria.  ENDOCRINE: No polyuria or nocturia. No heat or cold intolerance.  HEMATOLOGY: No anemia. No bruising. No bleeding.  INTEGUMENTARY: No rashes. No lesions.  MUSCULOSKELETAL: No arthritis. No swelling. No gout.  Pain in the right hip NEUROLOGIC: No numbness, tingling, or ataxia. No seizure-type activity.  PSYCHIATRIC: No anxiety. No insomnia. No ADD.   DRUG ALLERGIES:  No Known Allergies  VITALS:  Blood pressure (!) 154/85, pulse 76, temperature 98.5 F (36.9 C), temperature source Oral, resp. rate 17, height 5\' 6"  (1.676 m), weight 49.9 kg, SpO2 98 %.  PHYSICAL EXAMINATION:   Physical Exam  GENERAL:  85 y.o.-year-old patient lying in the bed with no acute distress.  EYES: Pupils equal, round, reactive to light and accommodation. No scleral icterus. Extraocular muscles intact.  HEENT: Head atraumatic, normocephalic. Oropharynx and nasopharynx clear.  NECK:  Supple, no jugular venous distention. No thyroid enlargement, no tenderness.  LUNGS: Normal breath sounds bilaterally, no wheezing, rales, rhonchi. No use of accessory muscles of respiration.   CARDIOVASCULAR: S1, S2 normal. No murmurs, rubs, or gallops.  ABDOMEN: Soft, nontender, nondistended. Bowel sounds present. No organomegaly or mass.  EXTREMITIES: No cyanosis, clubbing or edema b/l.    Tenderness in the right hip NEUROLOGIC: Cranial nerves II through XII are intact. No focal Motor or sensory deficits b/l.   PSYCHIATRIC: The patient is alert and oriented x 3.  SKIN: No obvious rash, lesion, or ulcer.   LABORATORY PANEL:   CBC Recent Labs  Lab 07/11/18 0456  WBC 8.1  HGB 10.8*  HCT 34.5*  PLT 180   ------------------------------------------------------------------------------------------------------------------ Chemistries  Recent Labs  Lab 07/10/18 2251 07/11/18 0456  NA 140 142  K 3.2* 3.5  CL 104 105  CO2 27 28  GLUCOSE 132* 165*  BUN 28* 26*  CREATININE 1.03* 0.98  CALCIUM 9.4 9.0  AST 59*  --   ALT 13  --   ALKPHOS 62  --   BILITOT 0.6  --    ------------------------------------------------------------------------------------------------------------------  Cardiac Enzymes No results for input(s): TROPONINI in the last 168 hours. ------------------------------------------------------------------------------------------------------------------  RADIOLOGY:  Dg Chest 1 View  Result Date: 07/10/2018 CLINICAL DATA:  Fall with right hip fracture EXAM: CHEST  1 VIEW COMPARISON:  None. FINDINGS: The lungs are hyperinflated with diffuse interstitial prominence. No focal airspace consolidation or pulmonary edema. No pleural effusion or pneumothorax. Normal cardiomediastinal contours. IMPRESSION: Hyperinflation without focal airspace disease. Electronically Signed   By: Deatra Robinson M.D.   On: 07/10/2018 23:26   Dg Hip Unilat W Or Wo Pelvis 2-3 Views Right  Result Date: 07/10/2018 CLINICAL DATA:  Hip pain after falling  EXAM: DG HIP (WITH OR WITHOUT PELVIS) 2-3V RIGHT COMPARISON:  None. FINDINGS: Oblique fracture of the proximal right femur extending  distally and laterally from the lesser trochanter. Moderate medial angulation. No dislocation. Small fracture fragment medially displaced from the lesser trochanter. IMPRESSION: Predominantly oblique fracture of the proximal right femur extending distally from the lesser trochanter. Electronically Signed   By: Deatra RobinsonKevin  Herman M.D.   On: 07/10/2018 23:25     ASSESSMENT AND PLAN:  85 year old female patient with history of hypertension currently under hospitalist service for mechanical fall  -Right hip fracture Orthopedic surgery evaluation Plan for ORIF procedure N.p.o. for now  -Right hip pain Continue oral narcotics and PRN morphine for pain management  -Questionable GI bleed Continue IV Protonix twice daily GI follow-up Monitor hemoglobin hematocrit  -Hypertension Medical management  -DVT prophylaxis sequential compression device to lower extremities for now  All the records are reviewed and case discussed with Care Management/Social Worker. Management plans discussed with the patient, family and they are in agreement.  CODE STATUS: Full code  DVT Prophylaxis: SCDs  TOTAL TIME TAKING CARE OF THIS PATIENT: 45 minutes.   POSSIBLE D/C IN 2 to 3 DAYS, DEPENDING ON CLINICAL CONDITION.  Ihor AustinPavan Chiniqua Kilcrease M.D on 07/11/2018 at 12:32 PM  Between 7am to 6pm - Pager - [phone]  After 6pm go to [email or web address] - password [email or web address] [facility]  [facility] [place] [facility]  Office  [phone]  CC: Primary care physician; Barbette ReichmannHande, Vishwanath, MD  Note: This dictation was prepared with Dragon dictation along with smaller phrase technology. Any transcriptional errors that result from this process are unintentional.

## 2018-07-11 NOTE — TOC Progression Note (Signed)
Transition of Care [facility]) - Progression Note    Patient Details  Name: Cindy Meyer MRN: [id number] Date of Birth: 02-Mar-1926  [facility] of [facility] CM/SW Contact  Barrie Dunker, RN Phone Number: 07/11/2018, 3:50 PM  Clinical Narrative:     Requested price for lovenox 40 mg thru CM bucket via email       Expected Discharge Plan and Services           Expected Discharge Date: 07/14/18                                     Social Determinants of Health (SDOH) Interventions    Readmission Risk Interventions No flowsheet data found.

## 2018-07-11 NOTE — Anesthesia Post-op Follow-up Note (Signed)
Anesthesia QCDR form completed.        

## 2018-07-12 ENCOUNTER — Encounter: Payer: Self-pay | Admitting: Orthopedic Surgery

## 2018-07-12 LAB — BASIC METABOLIC PANEL
Anion gap: 8 (ref 5–15)
BUN: 24 mg/dL — ABNORMAL HIGH (ref 8–23)
CO2: 27 mmol/L (ref 22–32)
Calcium: 8.6 mg/dL — ABNORMAL LOW (ref 8.9–10.3)
Chloride: 104 mmol/L (ref 98–111)
Creatinine, Ser: 1.02 mg/dL — ABNORMAL HIGH (ref 0.44–1.00)
GFR calc Af Amer: 56 mL/min — ABNORMAL LOW (ref >60)
GFR calc non Af Amer: 49 mL/min — ABNORMAL LOW (ref >60)
Glucose, Bld: 151 mg/dL — ABNORMAL HIGH (ref 70–99)
Potassium: 4 mmol/L (ref 3.5–5.1)
Sodium: 139 mmol/L (ref 135–145)

## 2018-07-12 LAB — CBC
HCT: 30.4 % — ABNORMAL LOW (ref 36.0–46.0)
Hemoglobin: 9.7 g/dL — ABNORMAL LOW (ref 12.0–15.0)
MCH: 28.6 pg (ref 26.0–34.0)
MCHC: 31.9 g/dL (ref 30.0–36.0)
MCV: 89.7 fL (ref 80.0–100.0)
Platelets: 162 10*3/uL (ref 150–400)
RBC: 3.39 MIL/uL — ABNORMAL LOW (ref 3.87–5.11)
RDW: 13.9 % (ref 11.5–15.5)
WBC: 7.6 10*3/uL (ref 4.0–10.5)
nRBC: 0 % (ref 0.0–0.2)

## 2018-07-12 MED ORDER — OXYCODONE HCL 5 MG PO TABS: 5.0000 mg | ORAL_TABLET | Freq: Four times a day (QID) | ORAL | 0 refills | Status: DC | PRN

## 2018-07-12 MED ORDER — ENOXAPARIN SODIUM 30 MG/0.3ML ~~LOC~~ SOLN: 30.0000 mg | SUBCUTANEOUS | 0 refills | Status: DC

## 2018-07-12 MED ORDER — FE FUMARATE-B12-VIT C-FA-IFC PO CAPS
1.0000 | ORAL_CAPSULE | Freq: Two times a day (BID) | ORAL | Status: DC
  Administered 2018-07-12 – 2018-07-14 (×6): 1 via ORAL
  Filled 2018-07-12 (×7): qty 1

## 2018-07-12 MED ORDER — PANTOPRAZOLE SODIUM 40 MG PO TBEC
40.0000 mg | DELAYED_RELEASE_TABLET | Freq: Every day | ORAL | Status: DC
  Administered 2018-07-12 – 2018-07-14 (×3): 40 mg via ORAL
  Filled 2018-07-12 (×3): qty 1

## 2018-07-12 MED ORDER — ENSURE ENLIVE PO LIQD
237.0000 mL | Freq: Two times a day (BID) | ORAL | Status: DC
  Administered 2018-07-12 – 2018-07-14 (×4): 237 mL via ORAL

## 2018-07-12 NOTE — Evaluation (Signed)
Physical Therapy Evaluation Patient Details Name: Cindy PikeHattie M Grabski MRN: [id number] DOB: 07/28/1926 Today's Date: 07/12/2018   History of Present Illness  admitted for acute hospitalization s/p mechanical fall with R hip pain; admitted for R hip subtrochanteric hip fracture s/p ORIF with IM rod (6/1), WBAT  Clinical Impression  Upon evaluation, patient alert and oriented to self, general location; reoriented to situation and recent surgery with limited recall (requiring multiple bouts of reorientation during session).  R LE generally weak and guarded due to post-op pain and soreness, intermittent difficulty coordinating isolated muscle activation with supine therex.  Currently requiring mod assist for bed mobility; mod assist for sit/stand and max assist +2 with RW for bed/chair transfer.  Unable to unweight and advance L LE (due to inability to tolerate WBing R LE); max assist for walker management, weight shift and LE position; tends to slide/scoot across floor vs step. Unsafe to attempt mobility without +2 at this time.  Will continue to assess/progress as appropriate. Would benefit from skilled PT to address above deficits and promote optimal return to PLOF; recommend transition to STR upon discharge from acute hospitalization.      Follow Up Recommendations SNF    Equipment Recommendations       Recommendations for Other Services       Precautions / Restrictions Precautions Precautions: Fall Restrictions Weight Bearing Restrictions: Yes RLE Weight Bearing: Weight bearing as tolerated      Mobility  Bed Mobility Overal bed mobility: Needs Assistance Bed Mobility: Supine to Sit     Supine to sit: Mod assist     General bed mobility comments: assist for LE management, hand-over-hand and step by step verbal cuing for task sequencing and initiation  Transfers Overall transfer level: Needs assistance Equipment used: Rolling walker (2 wheeled) Transfers: Sit to/from Stand Sit  to Stand: Mod assist            Ambulation/Gait Ambulation/Gait assistance: Mod assist;Max assist;+2 physical assistance Gait Distance (Feet): 4 Feet Assistive device: Rolling walker (2 wheeled)       General Gait Details: unable to unweight and advance L LE (due to inability to tolerate WBing R LE); max assist for walker management, weight shift and LE position; tends to slide/scoot across floor vs step. Constantly leaning forward with fatigue or pain during transfer; consistent cuing for postural extension and overall control.  Stairs            Wheelchair Mobility    Modified Rankin (Stroke Patients Only)       Balance Overall balance assessment: Needs assistance Sitting-balance support: No upper extremity supported;Feet supported Sitting balance-Leahy Scale: Fair   Postural control: Posterior lean Standing balance support: Bilateral upper extremity supported Standing balance-Leahy Scale: Zero                               Pertinent Vitals/Pain Pain Assessment: Faces Faces Pain Scale: Hurts even more Pain Location: R hip Pain Descriptors / Indicators: Aching;Grimacing;Guarding Pain Intervention(s): Limited activity within patient's tolerance;Monitored during session;Repositioned    Home Living Family/patient expects to be discharged to:: Private residence Living Arrangements: Alone Available Help at Discharge: Family;Available PRN/intermittently Type of Home: House       Home Layout: Two level;Bed/bath upstairs Home Equipment: None      Prior Function Level of Independence: Independent         Comments: Indep with ADLs, household and limited community mobilization; daughter assists with community errands  and transportation     Hand Dominance        Extremity/Trunk Assessment   Upper Extremity Assessment Upper Extremity Assessment: Overall WFL for tasks assessed    Lower Extremity Assessment Lower Extremity Assessment: (R  hip grossly 3-/5, limited by pain; fair R quad contraction.  Difficulty fully understanding task at times.)       Communication   Communication: No difficulties  Cognition Arousal/Alertness: Awake/alert Behavior During Therapy: WFL for tasks assessed/performed Overall Cognitive Status: No family/caregiver present to determine baseline cognitive functioning                                 General Comments: oriented to self, general location; unaware of reason for admission or recent surgery.  Reoriented to situation, but limited carry-over (requiring repetition/reorientation many times during session)      General Comments      Exercises Other Exercises Other Exercises: Supine R LE therex, 1x10, act assist ROM for muscular strength/flexibility: ankle pumps, quad sets, SAQs, heel slides, hip abduct/adduct.   Assessment/Plan    PT Assessment Patient needs continued PT services  PT Problem List Decreased strength;Decreased activity tolerance;Decreased range of motion;Decreased balance;Decreased mobility;Decreased coordination;Decreased cognition;Decreased knowledge of use of DME;Decreased safety awareness;Decreased knowledge of precautions       PT Treatment Interventions DME instruction;Gait training;Stair training;Functional mobility training;Therapeutic activities;Therapeutic exercise;Balance training;Cognitive remediation;Patient/family education    PT Goals (Current goals can be found in the Care Plan section)  Acute Rehab PT Goals Patient Stated Goal: to work with therapist PT Goal Formulation: With patient Time For Goal Achievement: 07/26/18 Potential to Achieve Goals: Good    Frequency BID   Barriers to discharge        Co-evaluation               AM-PAC PT "6 Clicks" Mobility  Outcome Measure Help needed turning from your back to your side while in a flat bed without using bedrails?: A Lot Help needed moving from lying on your back to sitting  on the side of a flat bed without using bedrails?: A Lot Help needed moving to and from a bed to a chair (including a wheelchair)?: A Lot Help needed standing up from a chair using your arms (e.g., wheelchair or bedside chair)?: A Lot Help needed to walk in hospital room?: A Lot Help needed climbing 3-5 steps with a railing? : Total 6 Click Score: 11    End of Session Equipment Utilized During Treatment: Gait belt Activity Tolerance: Patient limited by pain Patient left: in chair;with call bell/phone within reach;with chair alarm set Nurse Communication: Mobility status PT Visit Diagnosis: Muscle weakness (generalized) (M62.81);Difficulty in walking, not elsewhere classified (R26.2)    Time: 2800-3491 PT Time Calculation (min) (ACUTE ONLY): 34 min   Charges:   PT Evaluation $PT Eval Moderate Complexity: 1 Mod PT Treatments $Therapeutic Exercise: 8-22 mins       Lyndell Allaire H. Manson Passey, PT, DPT, NCS 07/12/18, 1:03 PM [phone]

## 2018-07-12 NOTE — Progress Notes (Signed)
Physical Therapy Treatment Patient Details Name: Cindy Meyer MRN: [id number] DOB: 10/12/1926 Today's Date: 07/12/2018    History of Present Illness admitted for acute hospitalization s/p mechanical fall with R hip pain; admitted for R hip subtrochanteric hip fracture s/p ORIF with IM rod (6/1), WBAT    PT Comments    Significant confusion persists this PM.  Limited recall of situation and recent surgery, AM session or interaction with therapist this date.  Difficulty following commands for active participation with session, requiring extensive assist +2 to safely and effectively complete all movement tasks.  Remains unable to tolerate weight acceptance through R LE for any attempts at stepping with either extremity. RN, MD informed/aware of confusion and impact on therapy sessions.     Follow Up Recommendations  SNF     Equipment Recommendations       Recommendations for Other Services       Precautions / Restrictions Precautions Precautions: Fall Restrictions Weight Bearing Restrictions: Yes RLE Weight Bearing: Weight bearing as tolerated    Mobility  Bed Mobility Overal bed mobility: Needs Assistance Bed Mobility: Sit to Supine     Supine to sit: Mod assist Sit to supine: +2 for physical assistance;Total assist   General bed mobility comments: total assist for trunk, LE management; patient startling easily, extending arms, somewhat resisting movement  Transfers Overall transfer level: Needs assistance Equipment used: Rolling walker (2 wheeled) Transfers: Sit to/from Stand Sit to Stand: Max assist;+2 physical assistance         General transfer comment: max assist for lift off, standing balance, postural extension; difficulty coordinating functional movement task  Ambulation/Gait Ambulation/Gait assistance: Mod assist;Max assist;+2 physical assistance Gait Distance (Feet): 4 Feet Assistive device: Rolling walker (2 wheeled)       General Gait Details:  unable to weight shift, coordinate extremities for advancement of either extremity for safe gait efforts.   Stairs             Wheelchair Mobility    Modified Rankin (Stroke Patients Only)       Balance Overall balance assessment: Needs assistance Sitting-balance support: No upper extremity supported;Feet supported Sitting balance-Leahy Scale: Fair   Postural control: Posterior lean Standing balance support: Bilateral upper extremity supported Standing balance-Leahy Scale: Zero                              Cognition Arousal/Alertness: Awake/alert Behavior During Therapy: WFL for tasks assessed/performed Overall Cognitive Status: No family/caregiver present to determine baseline cognitive functioning                                 General Comments: oriented to self, general location; unaware of reason for admission or recent surgery.  Reoriented to situation, but limited carry-over (requiring repetition/reorientation many times during session)      Exercises Other Exercises Other Exercises: Sit/stand x2 with RW, max assist +2 for lift off, standing balance, psotural extension Other Exercises: Scoot pivot tranfser, chair to bed, towards L, total assist +2 for safety.  Hand-over-hand assist for weight shift, trunk position and overall movement transition. Patient unable to fully comprehend movement pattern to actively assist.    General Comments        Pertinent Vitals/Pain Pain Assessment: Faces Faces Pain Scale: Hurts whole lot Pain Location: R hip Pain Descriptors / Indicators: Aching;Grimacing;Guarding Pain Intervention(s): Limited activity within patient's tolerance;Monitored during session;Repositioned  Home Living Family/patient expects to be discharged to:: Private residence Living Arrangements: Alone Available Help at Discharge: Family;Available PRN/intermittently Type of Home: House     Home Layout: Two level;Bed/bath  upstairs Home Equipment: None      Prior Function Level of Independence: Independent      Comments: Indep with ADLs, household and limited community mobilization; daughter assists with community errands and transportation   PT Goals (current goals can now be found in the care plan section) Acute Rehab PT Goals Patient Stated Goal: to work with therapist PT Goal Formulation: With patient Time For Goal Achievement: 07/26/18 Potential to Achieve Goals: Good Progress towards PT goals: Not progressing toward goals - comment(limited carry-over; difficulty comprehending instruction/expectations at times)    Frequency    BID      PT Plan Current plan remains appropriate    Co-evaluation              AM-PAC PT "6 Clicks" Mobility   Outcome Measure  Help needed turning from your back to your side while in a flat bed without using bedrails?: Total Help needed moving from lying on your back to sitting on the side of a flat bed without using bedrails?: Total Help needed moving to and from a bed to a chair (including a wheelchair)?: Total Help needed standing up from a chair using your arms (e.g., wheelchair or bedside chair)?: Total Help needed to walk in hospital room?: Total Help needed climbing 3-5 steps with a railing? 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 Manson Passey, PT, DPT, NCS 07/12/18, 2:28 PM [phone]

## 2018-07-12 NOTE — Progress Notes (Signed)
[facility] [place] [facility]   PATIENT NAME: Cindy Meyer    MR#:  [id number]  DATE OF BIRTH:  [birth year]  SUBJECTIVE:  CHIEF COMPLAINT:   Chief Complaint  Patient presents with  . Fall  . Hip Pain  Patient seen and evaluated today  pain in the right hip better No new episodes of vomiting of blood No rectal bleed .  Postop day 1 Had ORIF procedure yesterday  REVIEW OF SYSTEMS:    ROS  CONSTITUTIONAL: No documented fever. No fatigue, weakness. No weight gain, no weight loss.  EYES: No blurry or double vision.  ENT: No tinnitus. No postnasal drip. No redness of the oropharynx.  RESPIRATORY: No cough, no wheeze, no hemoptysis. No dyspnea.  CARDIOVASCULAR: No chest pain. No orthopnea. No palpitations. No syncope.  GASTROINTESTINAL: No nausea, no vomiting or diarrhea. No abdominal pain. No melena or hematochezia.  GENITOURINARY: No dysuria or hematuria.  ENDOCRINE: No polyuria or nocturia. No heat or cold intolerance.  HEMATOLOGY: No anemia. No bruising. No bleeding.  INTEGUMENTARY: No rashes. No lesions.  MUSCULOSKELETAL: No arthritis. No swelling. No gout.  Pain in the right hip NEUROLOGIC: No numbness, tingling, or ataxia. No seizure-type activity.  PSYCHIATRIC: No anxiety. No insomnia. No ADD.   DRUG ALLERGIES:  No Known Allergies  VITALS:  Blood pressure 140/66, pulse 95, temperature 98 F (36.7 C), temperature source Oral, resp. rate 16, height 5\' 6"  (1.676 m), weight 49.9 kg, SpO2 100 %.  PHYSICAL EXAMINATION:   Physical Exam  GENERAL:  85 y.o.-year-old patient lying in the bed with no acute distress.  EYES: Pupils equal, round, reactive to light and accommodation. No scleral icterus. Extraocular muscles intact.  HEENT: Head atraumatic, normocephalic. Oropharynx and nasopharynx clear.  NECK:  Supple, no jugular venous distention. No thyroid enlargement, no tenderness.  LUNGS: Normal breath sounds bilaterally, no wheezing, rales,  rhonchi. No use of accessory muscles of respiration.  CARDIOVASCULAR: S1, S2 normal. No murmurs, rubs, or gallops.  ABDOMEN: Soft, nontender, nondistended. Bowel sounds present. No organomegaly or mass.  EXTREMITIES: No cyanosis, clubbing or edema b/l.    Tenderness in the right hip Bandage intact NEUROLOGIC: Cranial nerves II through XII are intact. No focal Motor or sensory deficits b/l.   PSYCHIATRIC: The patient is alert and oriented x 3.  SKIN: No obvious rash, lesion, or ulcer.   LABORATORY PANEL:   CBC Recent Labs  Lab 07/12/18 0310  WBC 7.6  HGB 9.7*  HCT 30.4*  PLT 162   ------------------------------------------------------------------------------------------------------------------ Chemistries  Recent Labs  Lab 07/10/18 2251  07/12/18 0310  NA 140   < > 139  K 3.2*   < > 4.0  CL 104   < > 104  CO2 27   < > 27  GLUCOSE 132*   < > 151*  BUN 28*   < > 24*  CREATININE 1.03*   < > 1.02*  CALCIUM 9.4   < > 8.6*  AST 59*  --   --   ALT 13  --   --   ALKPHOS 62  --   --   BILITOT 0.6  --   --    < > = values in this interval not displayed.   ------------------------------------------------------------------------------------------------------------------  Cardiac Enzymes No results for input(s): TROPONINI in the last 168 hours. ------------------------------------------------------------------------------------------------------------------  RADIOLOGY:  Dg Chest 1 View  Result Date: 07/10/2018 CLINICAL DATA:  Fall with right hip fracture EXAM: CHEST  1 VIEW COMPARISON:  None.  FINDINGS: The lungs are hyperinflated with diffuse interstitial prominence. No focal airspace consolidation or pulmonary edema. No pleural effusion or pneumothorax. Normal cardiomediastinal contours. IMPRESSION: Hyperinflation without focal airspace disease. Electronically Signed   By: Deatra RobinsonKevin  Herman M.D.   On: 07/10/2018 23:26   Dg Hip Operative Unilat W Or W/o Pelvis Right  Result Date:  07/11/2018 CLINICAL DATA:  Hip pain EXAM: OPERATIVE right HIP (WITH PELVIS IF PERFORMED) 7 VIEWS TECHNIQUE: Fluoroscopic spot image(s) were submitted for interpretation post-operatively. COMPARISON:  07/10/2018 FINDINGS: The patient has undergone intramedullary nail placement of the right femur. The alignment appears significantly improved. There is no unexpected radiopaque foreign body. IMPRESSION: Status post ORIF of the right hip. Electronically Signed   By: Katherine Mantlehristopher  Green M.D.   On: 07/11/2018 19:44   Dg Hip Unilat W Or Wo Pelvis 2-3 Views Right  Result Date: 07/10/2018 CLINICAL DATA:  Hip pain after falling EXAM: DG HIP (WITH OR WITHOUT PELVIS) 2-3V RIGHT COMPARISON:  None. FINDINGS: Oblique fracture of the proximal right femur extending distally and laterally from the lesser trochanter. Moderate medial angulation. No dislocation. Small fracture fragment medially displaced from the lesser trochanter. IMPRESSION: Predominantly oblique fracture of the proximal right femur extending distally from the lesser trochanter. Electronically Signed   By: Deatra RobinsonKevin  Herman M.D.   On: 07/10/2018 23:25     ASSESSMENT AND PLAN:  85 year old female patient with history of hypertension currently under hospitalist service for mechanical fall  -Right hip fracture Orthopedic surgery follow-up appreciated Status post ORIF procedure Postop day 1 Plan for ORIF procedure Physical therapy today  -Right hip pain Continue oral narcotics and PRN morphine for pain management  -Questionable GI bleed Hemoglobin 9.7 Switch to oral Protonix Discontinue IV Protonix GI follow-up Monitor hemoglobin hematocrit  -Hypertension Medical management  -DVT prophylaxis sequential compression device to lower extremities and Lovenox subcutaneously  All the records are reviewed and case discussed with Care Management/Social Worker. Management plans discussed with the patient, family and they are in agreement.  CODE STATUS:  Full code  DVT Prophylaxis: SCDs  TOTAL TIME TAKING CARE OF THIS PATIENT: 38 minutes.   POSSIBLE D/C IN 2 to 3 DAYS, DEPENDING ON CLINICAL CONDITION.  Ihor AustinPavan Metha Kolasa M.D on 07/12/2018 at 9:46 AM  Between 7am to 6pm - Pager - [phone]  After 6pm go to [email or web address] - password [email or web address] [facility]  [facility] [place] [facility]  Office  [phone]  CC: Primary care physician; Barbette ReichmannHande, Vishwanath, MD  Note: This dictation was prepared with Dragon dictation along with smaller phrase technology. Any transcriptional errors that result from this process are unintentional.

## 2018-07-12 NOTE — TOC Initial Note (Signed)
Transition of Care [facility]) - Initial/Assessment Note    Patient Details  Name: Cindy Meyer MRN: [id number] Date of Birth: [birth year]  [facility] CM/SW Contact:    Cindy Hilt, RN Phone Number: 07/12/2018, 4:25 PM  Clinical Narrative:                  Met with the patient to discuss DC pkan and needs, she states that she lives at home alone and she has ne desire to go to rehab, she refuses to even consider it, she states that she does not want home health either and feels that she does not need it, I encouraged her to at least let them come  A couple of times, she states she is fine and does not need it, I encouraged her to let me get her a RW for home, She states that she does not want or need it, she feels that anyone can have an accident and that she is fine at home, She has transportation with her  [place] daughter Cindy Meyer when needed, She states that she has everything and does not want anything and will not consent to anything Sees Dr Cindy Meyer as PCP and has [employer] pharamcy She said she can afford her medciations  Expected Discharge Plan: Skilled Nursing Facility Barriers to Discharge: Continued Medical Work up   Patient Goals and CMS Choice Patient states their goals for this hospitalization and ongoing recovery are:: to go home CMS Medicare.gov Compare Post Acute Care list provided to:: Patient Choice offered to / list presented to : Patient  Expected Discharge Plan and Services Expected Discharge Plan: [place]   Discharge Planning Services: CM Consult Post Acute Care Choice: Home Health   Expected Discharge Date: 07/14/18               DME Arranged: Patient refused services         HH Arranged: Patient Refused HH          Prior Living Arrangements/Services   Lives with:: Self Patient language and need for interpreter reviewed:: No Do you feel safe going back to the place where you live?: Yes      Need for Family Participation  in Patient Care: No (Comment) Care giver support system in place?: Yes (comment)   Criminal Activity/Legal Involvement Pertinent to Current Situation/Hospitalization: No - Comment as needed  Activities of Daily Living Home Assistive Devices/Equipment: None ADL Screening (condition at time of admission) Patient's cognitive ability adequate to safely complete daily activities?: Yes Is the patient deaf or have difficulty hearing?: No Does the patient have difficulty seeing, even when wearing glasses/contacts?: No Does the patient have difficulty concentrating, remembering, or making decisions?: No Patient able to express need for assistance with ADLs?: Yes Does the patient have difficulty dressing or bathing?: No Independently performs ADLs?: Yes (appropriate for developmental age) Does the patient have difficulty walking or climbing stairs?: No Weakness of Legs: None Weakness of Arms/Hands: None  Permission Sought/Granted Permission sought to share information with : Case Manager                Emotional Assessment Appearance:: Appears stated age Attitude/Demeanor/Rapport: Engaged Affect (typically observed): Accepting Orientation: : Oriented to Self, Oriented to Place, Oriented to  Time, Oriented to Situation Alcohol / Substance Use: Never Used Psych Involvement: No (comment)  Admission diagnosis:  Closed fracture of right hip, initial encounter ([facility]) [S72.001A] Hip fx, right, closed, initial encounter [facility]) [S72.001A] Patient Active Problem  List   Diagnosis Date Noted  . Hip fracture (Ong) 07/11/2018  . HTN (hypertension) 07/11/2018   PCP:  Cindy Harrier, MD Pharmacy:   [facility] [address] [place] - [place] [address] [place] [address] Phone: [phone] Fax: [phone]     Social Determinants of Health (SDOH) Interventions    Readmission Risk Interventions No flowsheet data found.

## 2018-07-12 NOTE — Progress Notes (Addendum)
   Subjective: 1 Day Post-Op Procedure(s) (LRB): INTRAMEDULLARY (IM) NAIL INTERTROCHANTRIC RIGHT LONG AFFIXUS (Right) Patient reports pain as mild.   Patient is well, and has had no acute complaints or problems Denies any CP, SOB, ABD pain. We will start therapy today.    Objective: Vital signs in last 24 hours: Temp:  [97.7 F (36.5 C)-99.6 F (37.6 C)] 98.3 F (36.8 C) (06/02 0312) Pulse Rate:  [76-106] 91 (06/02 0312) Resp:  [12-24] 16 (06/02 0312) BP: (116-169)/(64-86) 129/70 (06/02 0312) SpO2:  [96 %-100 %] 100 % (06/02 0312) Weight:  [49.9 kg] 49.9 kg (06/01 1542)  Intake/Output from previous day: 06/01 0701 - 06/02 0700 In: 1057.1 [I.V.:1006.6; IV Piggyback:50.5] Out: 800 [Urine:500; Blood:300] Intake/Output this shift: No intake/output data recorded.  Recent Labs    07/10/18 2251 07/11/18 0456 07/12/18 0310  HGB 12.0 10.8* 9.7*   Recent Labs    07/11/18 0456 07/12/18 0310  WBC 8.1 7.6  RBC 3.83* 3.39*  HCT 34.5* 30.4*  PLT 180 162   Recent Labs    07/11/18 0456 07/12/18 0310  NA 142 139  K 3.5 4.0  CL 105 104  CO2 28 27  BUN 26* 24*  CREATININE 0.98 1.02*  GLUCOSE 165* 151*  CALCIUM 9.0 8.6*   Recent Labs    07/11/18 0859  INR 1.0    EXAM General - Patient is Alert, Appropriate and Oriented Extremity - Neurovascular intact Sensation intact distally Intact pulses distally Dorsiflexion/Plantar flexion intact No cellulitis present Compartment soft Dressing - dressing C/D/I and moderate drainage Motor Function - intact, moving foot and toes well on exam.   Past Medical History:  Diagnosis Date  . Hypertension     Assessment/Plan:   1 Day Post-Op Procedure(s) (LRB): INTRAMEDULLARY (IM) NAIL INTERTROCHANTRIC RIGHT LONG AFFIXUS (Right) Principal Problem:   Hip fracture (HCC) Active Problems:   HTN (hypertension)  Estimated body mass index is 17.75 kg/m as calculated from the following:   Height as of this encounter: 5\' 6"   (1.676 m).   Weight as of this encounter: 49.9 kg. Advance diet Up with therapy  Needs bowel movement Hemoglobin 9.7.  Continue with iron supplementation. Vital signs stable Care manager to assist with discharge  Skilled nursing facility to remove staples on 07/25/2018 TED hose bilateral lower extremities x6 weeks Lovenox 30 mg daily x14 days Follow-up with [facility] orthopedics in 6 weeks for x-rays  DVT Prophylaxis - Lovenox, TED hose and SCDs Weight-Bearing as tolerated to right leg   T. Cranston Neighbor, PA-C [facility] 07/12/2018, 7:10 AM

## 2018-07-12 NOTE — TOC Progression Note (Signed)
Transition of Care [facility]) - Progression Note    Patient Details  Name: Cindy Meyer MRN: [id number] Date of Birth: 01/30/1927  [facility]) CM/SW Contact  Barrie Dunker, RN Phone Number: 07/12/2018, 4:33 PM  Clinical Narrative:     I called the grand daughter Tiffany and requested her assistance in getting the -patient to at least accept [facility] PT if not SNF, I also requested her assistance in getting the patient to accept a RW to help at home, She stated that she and her mother would talk to the patient and try to convince her to accept [facility] services as well as a RW,  I will touch base with her tomorrow  Expected Discharge Plan: Skilled Nursing Facility Barriers to Discharge: Continued Medical Work up  Expected Discharge Plan and Services Expected Discharge Plan: Skilled Nursing Facility   Discharge Planning Services: CM Consult Post Acute Care Choice: Home Health   Expected Discharge Date: 07/14/18               DME Arranged: Patient refused services         HH Arranged: Patient Refused HH           Social Determinants of Health (SDOH) Interventions    Readmission Risk Interventions No flowsheet data found.

## 2018-07-13 LAB — TYPE AND SCREEN
ABO/RH(D): A POS
Antibody Screen: NEGATIVE
Unit division: 0
Unit division: 0
Unit division: 0
Unit division: 0

## 2018-07-13 LAB — BPAM RBC
Blood Product Expiration Date: 202006182359
Blood Product Expiration Date: 202006182359
Blood Product Expiration Date: 202006182359
Blood Product Expiration Date: 202006182359
Unit Type and Rh: 6200
Unit Type and Rh: 6200
Unit Type and Rh: 6200
Unit Type and Rh: 6200

## 2018-07-13 LAB — BASIC METABOLIC PANEL
Anion gap: 5 (ref 5–15)
BUN: 21 mg/dL (ref 8–23)
CO2: 28 mmol/L (ref 22–32)
Calcium: 8.4 mg/dL — ABNORMAL LOW (ref 8.9–10.3)
Chloride: 105 mmol/L (ref 98–111)
Creatinine, Ser: 0.87 mg/dL (ref 0.44–1.00)
GFR calc Af Amer: >60 mL/min (ref >60)
GFR calc non Af Amer: 59 mL/min — ABNORMAL LOW (ref >60)
Glucose, Bld: 132 mg/dL — ABNORMAL HIGH (ref 70–99)
Potassium: 3.7 mmol/L (ref 3.5–5.1)
Sodium: 138 mmol/L (ref 135–145)

## 2018-07-13 LAB — CBC
HCT: 28.2 % — ABNORMAL LOW (ref 36.0–46.0)
Hemoglobin: 8.7 g/dL — ABNORMAL LOW (ref 12.0–15.0)
MCH: 28.3 pg (ref 26.0–34.0)
MCHC: 30.9 g/dL (ref 30.0–36.0)
MCV: 91.9 fL (ref 80.0–100.0)
Platelets: 144 10*3/uL — ABNORMAL LOW (ref 150–400)
RBC: 3.07 MIL/uL — ABNORMAL LOW (ref 3.87–5.11)
RDW: 13.8 % (ref 11.5–15.5)
WBC: 8 10*3/uL (ref 4.0–10.5)
nRBC: 0 % (ref 0.0–0.2)

## 2018-07-13 LAB — PREPARE RBC (CROSSMATCH)

## 2018-07-13 NOTE — [place] FL2 (Signed)
[place] MEDICAID FL2 LEVEL OF CARE SCREENING TOOL     IDENTIFICATION  Patient Name: Cindy Meyer Birthdate: 1926-02-12 Sex: female Admission Date (Current Location): 07/10/2018  [place] and [place] Number:  Chiropodist and Address:  [facility], [address], [place], [place] [address]      Provider Number: [id number]  Attending Physician Name and Address:  Ihor Austin, MD  Relative Name and Phone Number:  Dara Lords Daughter [phone]    Current Level of Care: Hospital Recommended Level of Care: Skilled Nursing Facility Prior Approval Number:    Date Approved/Denied: 07/13/18 PASRR Number: [id number]  Discharge Plan: SNF    Current Diagnoses: Patient Active Problem List   Diagnosis Date Noted  . Hip fracture (HCC) 07/11/2018  . HTN (hypertension) 07/11/2018    Orientation RESPIRATION BLADDER Height & Weight     Self, Time, Situation, Place  Normal Continent Weight: 49.9 kg Height:  5\' 6"  (167.6 cm)  BEHAVIORAL SYMPTOMS/MOOD NEUROLOGICAL BOWEL NUTRITION STATUS      Continent Diet(regular)  AMBULATORY STATUS COMMUNICATION OF NEEDS Skin   Extensive Assist Verbally Normal, Surgical wounds                       Personal Care Assistance Level of Assistance  Bathing, Dressing Bathing Assistance: Limited assistance   Dressing Assistance: Limited assistance     Functional Limitations Info  Sight, Hearing, Speech Sight Info: Adequate Hearing Info: Adequate Speech Info: Adequate    SPECIAL CARE FACTORS FREQUENCY  PT (By licensed PT)     PT Frequency: 5 times per week              Contractures Contractures Info: Not present    Additional Factors Info  Code Status Code Status Info: full             Current Medications (07/13/2018):  This is the current hospital active medication list Current Facility-Administered Medications  Medication Dose Route Frequency Provider Last Rate Last Dose  . 0.9 %   sodium chloride infusion   Intravenous Continuous Kennedy Bucker, MD   Stopped at 07/12/18 1258  . acetaminophen (TYLENOL) tablet 650 mg  650 mg Oral Q6H PRN Kennedy Bucker, MD       Or  . acetaminophen (TYLENOL) suppository 650 mg  650 mg Rectal Q6H PRN Kennedy Bucker, MD      . alum & mag hydroxide-simeth (MAALOX/MYLANTA) 200-200-20 MG/5ML suspension 30 mL  30 mL Oral Q4H PRN Kennedy Bucker, MD      . bisacodyl (DULCOLAX) suppository 10 mg  10 mg Rectal Daily PRN Kennedy Bucker, MD      . docusate sodium (COLACE) capsule 100 mg  100 mg Oral BID Kennedy Bucker, MD   100 mg at 07/13/18 0926  . enoxaparin (LOVENOX) injection 30 mg  30 mg Subcutaneous Q24H Kennedy Bucker, MD   30 mg at 07/13/18 0810  . feeding supplement (ENSURE ENLIVE) (ENSURE ENLIVE) liquid 237 mL  237 mL Oral BID BM Pyreddy, Vivien Rota, MD   237 mL at 07/13/18 0925  . ferrous fumarate-b12-vitamic C-folic acid (TRINSICON / FOLTRIN) capsule 1 capsule  1 capsule Oral BID PC Kennedy Bucker, MD   1 capsule at 07/13/18 0810  . hydrochlorothiazide (HYDRODIURIL) tablet 12.5 mg  12.5 mg Oral Daily Kennedy Bucker, MD   12.5 mg at 07/13/18 0926  . HYDROcodone-acetaminophen (NORCO/VICODIN) 5-325 MG per tablet 1-2 tablet  1-2 tablet Oral Q4H PRN Kennedy Bucker, MD   1  tablet at 07/12/18 2229  . lactated ringers infusion   Intravenous Continuous Kennedy BuckerMenz, Michael, MD 100 mL/hr at 07/11/18 1544    . magnesium citrate solution 1 Bottle  1 Bottle Oral Once PRN Kennedy BuckerMenz, Michael, MD      . magnesium hydroxide (MILK OF MAGNESIA) suspension 30 mL  30 mL Oral Daily PRN Kennedy BuckerMenz, Michael, MD   30 mL at 07/13/18 0927  . menthol-cetylpyridinium (CEPACOL) lozenge 3 mg  1 lozenge Oral PRN Kennedy BuckerMenz, Michael, MD       Or  . phenol (CHLORASEPTIC) mouth spray 1 spray  1 spray Mouth/Throat PRN Kennedy BuckerMenz, Michael, MD      . methocarbamol (ROBAXIN) tablet 500 mg  500 mg Oral Q6H PRN Kennedy BuckerMenz, Michael, MD       Or  . methocarbamol (ROBAXIN) 500 mg in dextrose 5 % 50 mL IVPB  500 mg Intravenous Q6H PRN  Kennedy BuckerMenz, Michael, MD      . metoCLOPramide (REGLAN) tablet 5-10 mg  5-10 mg Oral Q8H PRN Kennedy BuckerMenz, Michael, MD       Or  . metoCLOPramide (REGLAN) injection 5-10 mg  5-10 mg Intravenous Q8H PRN Kennedy BuckerMenz, Michael, MD      . morphine 2 MG/ML injection 0.5-1 mg  0.5-1 mg Intravenous Q2H PRN Kennedy BuckerMenz, Michael, MD      . morphine 2 MG/ML injection 2 mg  2 mg Intravenous Q4H PRN Kennedy BuckerMenz, Michael, MD   2 mg at 07/11/18 1405  . ondansetron (ZOFRAN) tablet 4 mg  4 mg Oral Q6H PRN Kennedy BuckerMenz, Michael, MD       Or  . ondansetron [facility](ZOFRAN) injection 4 mg  4 mg Intravenous Q6H PRN Kennedy BuckerMenz, Michael, MD      . oxyCODONE (Oxy IR/ROXICODONE) immediate release tablet 5 mg  5 mg Oral Q4H PRN Kennedy BuckerMenz, Michael, MD   5 mg at 07/13/18 0810  . pantoprazole (PROTONIX) EC tablet 40 mg  40 mg Oral Daily Pyreddy, Vivien RotaPavan, MD   40 mg at 07/13/18 [id number]     Discharge Medications: Please see discharge summary for a list of discharge medications.  Relevant Imaging Results:  Relevant Lab Results:   Additional Information [id number]  Barrie Dunkereliliah J Daryan Buell, RN

## 2018-07-13 NOTE — TOC Progression Note (Signed)
Transition of Care [facility]) - Progression Note    Patient Details  Name: Cindy Meyer MRN: [id number] Date of Birth: 23-Dec-1926  Transition [facility]) CM/SW Contact  Barrie Dunker, RN Phone Number: 07/13/2018, 11:05 AM  Clinical Narrative:    I spoke wioth the daughter Zannie Cove, after talking to PT in house, the patient is not walking and unable to stand alone, she is not safe to go home, The daughter is agreeable to the patient going to SNF and she gives permission for the bed search being done, she will talk to the patient about going to rehab and get her onboard Will send FL2 and PASSR to do bed search Notified Brad  At [facility] and and Clydie Braun at [facility] that she may not DC home   Expected Discharge Plan: Skilled Nursing Facility Barriers to Discharge: Continued Medical Work up  Expected Discharge Plan and Services Expected Discharge Plan: Skilled Nursing Facility   Discharge Planning Services: CM Consult Post Acute Care Choice: Home Health   Expected Discharge Date: 07/14/18               DME Arranged: 3-N-1 DME Agency: [facility] Date DME Agency Contacted: 07/13/18 Time DME Agency Contacted: [phone] Representative spoke with at DME Agency: Nida Boatman HH Arranged: PT, Nurse's Aide HH Agency: [facility] Date HH Agency Contacted: 07/13/18 Time HH Agency Contacted: 1050 Representative spoke with at [facility] Agency: Clydie Braun   Social Determinants of Health (SDOH) Interventions    Readmission Risk Interventions No flowsheet data found.

## 2018-07-13 NOTE — Progress Notes (Signed)
Physical Therapy Treatment Patient Details Name: Benay PikeHattie M Laker MRN: [id number] DOB: 02/25/1926 Today's Date: 07/13/2018    History of Present Illness admitted for acute hospitalization s/p mechanical fall with R hip pain; admitted for R hip subtrochanteric hip fracture s/p ORIF with IM rod (6/1), WBAT    PT Comments    Attempted use of bilat PFRW, with goal of facilitating postural extension/support and assisting with unweighting/advancing bilat LEs.  Minimal/no change in gait performance of overall activity tolerance despite attempt at new assist device. Significant difficulty purposefully coordinating activation/termination of LE musculature for functional movement patterns with all transfers and gait. Ultimately, requiring scoot pivot, total assist +2 for return to bed. Unsafe/unable to complete via standing/RW.   Follow Up Recommendations  SNF     Equipment Recommendations       Recommendations for Other Services       Precautions / Restrictions Precautions Precautions: Fall Restrictions Weight Bearing Restrictions: Yes RLE Weight Bearing: Weight bearing as tolerated    Mobility  Bed Mobility Overal bed mobility: Needs Assistance Bed Mobility: Sit to Supine     Supine to sit: Max assist;+2 for safety/equipment Sit to supine: Total assist;+2 for physical assistance   General bed mobility comments: instructed to 'hug' pillow to minimize startle response with return to supine  Transfers Overall transfer level: Needs assistance Equipment used: Bilateral platform walker Transfers: Sit to/from Stand Sit to Stand: Max assist;+2 physical assistance         General transfer comment: max assist for lift off, standing balance, postural extension; difficulty coordinating functional movement task  Ambulation/Gait Ambulation/Gait assistance: Max assist;+2 physical assistance Gait Distance (Feet): 3 Feet Assistive device: Bilateral platform walker       General Gait  Details: dep assist for postural control, walker management and LE advancement; limited active WBing/use of bilat LEs. Poor comprehension of role and use of bilat PFRW   Stairs             Wheelchair Mobility    Modified Rankin (Stroke Patients Only)       Balance Overall balance assessment: Needs assistance Sitting-balance support: No upper extremity supported;Feet supported Sitting balance-Leahy Scale: Fair     Standing balance support: Bilateral upper extremity supported Standing balance-Leahy Scale: Zero                              Cognition Arousal/Alertness: Awake/alert Behavior During Therapy: WFL for tasks assessed/performed Overall Cognitive Status: No family/caregiver present to determine baseline cognitive functioning                                 General Comments: remains unaware of situation, recent surgery, associated deficits and level of assist      Exercises  Recliner to bed, scoot pivot over level surfaces, total assist +2 for trunk position, lift off and lateral movement.  Poor ability to actively participate/assist with movement strategy.    General Comments        Pertinent Vitals/Pain Pain Assessment: Faces Faces Pain Scale: Hurts whole lot Pain Location: R hip Pain Descriptors / Indicators: Aching;Grimacing;Guarding Pain Intervention(s): Limited activity within patient's tolerance;Monitored during session;Repositioned    Home Living                      Prior Function            PT Goals (  current goals can now be found in the care plan section) Acute Rehab PT Goals Patient Stated Goal: to work with therapist PT Goal Formulation: With patient Time For Goal Achievement: 07/26/18 Potential to Achieve Goals: Fair Progress towards PT goals: Not progressing toward goals - comment    Frequency    BID      PT Plan Current plan remains appropriate    Co-evaluation               AM-PAC PT "6 Clicks" Mobility   Outcome Measure  Help needed turning from your back to your side while in a flat bed without using bedrails?: Total Help needed moving from lying on your back to sitting on the side of a flat bed without using bedrails?: Total Help needed moving to and from a bed to a chair (including a wheelchair)?: Total Help needed standing up from a chair using your arms (e.g., wheelchair or bedside chair)?: Total Help needed to walk in hospital room?: Total Help needed climbing 3-5 steps with a railing? : Total 6 Click Score: 6    End of Session Equipment Utilized During Treatment: Gait belt Activity Tolerance: Patient limited by pain Patient left: with call bell/phone within reach;with bed alarm set;in bed Nurse Communication: Mobility status PT Visit Diagnosis: Muscle weakness (generalized) (M62.81);Difficulty in walking, not elsewhere classified (R26.2)     Time: 2094-7096 PT Time Calculation (min) (ACUTE ONLY): 23 min  Charges:  $Therapeutic Activity: 23-37 mins                     Dyquan Minks H. Manson Passey, PT, DPT, NCS 07/13/18, 2:49 PM [phone]

## 2018-07-13 NOTE — Progress Notes (Signed)
[employer] [facility] - [place] at [facility]   PATIENT NAME: Cindy Meyer    MR#:  [id number]  DATE OF BIRTH:  [birth year]  SUBJECTIVE:  CHIEF COMPLAINT:   Chief Complaint  Patient presents with  . Fall  . Hip Pain  Patient seen and evaluated today Was a little confused yesterday when physical therapy evaluated the patient Patient is awake more oriented this morning Able to recollect her name, oriented to self time and place Decreased pain in the right hip  pain in the right hip better No new episodes of vomiting of blood No rectal bleed .  Postop day 2  REVIEW OF SYSTEMS:    ROS  CONSTITUTIONAL: No documented fever. No fatigue, weakness. No weight gain, no weight loss.  EYES: No blurry or double vision.  ENT: No tinnitus. No postnasal drip. No redness of the oropharynx.  RESPIRATORY: No cough, no wheeze, no hemoptysis. No dyspnea.  CARDIOVASCULAR: No chest pain. No orthopnea. No palpitations. No syncope.  GASTROINTESTINAL: No nausea, no vomiting or diarrhea. No abdominal pain. No melena or hematochezia.  GENITOURINARY: No dysuria or hematuria.  ENDOCRINE: No polyuria or nocturia. No heat or cold intolerance.  HEMATOLOGY: No anemia. No bruising. No bleeding.  INTEGUMENTARY: No rashes. No lesions.  MUSCULOSKELETAL: No arthritis. No swelling. No gout.  Pain in the right hip NEUROLOGIC: No numbness, tingling, or ataxia. No seizure-type activity.  PSYCHIATRIC: No anxiety. No insomnia. No ADD.   DRUG ALLERGIES:  No Known Allergies  VITALS:  Blood pressure 130/72, pulse (!) 105, temperature 98.6 F (37 C), temperature source Oral, resp. rate 19, height 5\' 6"  (1.676 m), weight 49.9 kg, SpO2 100 %.  PHYSICAL EXAMINATION:   Physical Exam  GENERAL:  85 y.o.-year-old patient lying in the bed with no acute distress.  EYES: Pupils equal, round, reactive to light and accommodation. No scleral icterus. Extraocular muscles intact.  HEENT: Head atraumatic, normocephalic.  Oropharynx and nasopharynx clear.  NECK:  Supple, no jugular venous distention. No thyroid enlargement, no tenderness.  LUNGS: Normal breath sounds bilaterally, no wheezing, rales, rhonchi. No use of accessory muscles of respiration.  CARDIOVASCULAR: S1, S2 normal. No murmurs, rubs, or gallops.  ABDOMEN: Soft, nontender, nondistended. Bowel sounds present. No organomegaly or mass.  EXTREMITIES: No cyanosis, clubbing or edema b/l.    Tenderness in the right hip Bandage intact NEUROLOGIC: Cranial nerves II through XII are intact. No focal Motor or sensory deficits b/l.   PSYCHIATRIC: The patient is alert and oriented x 3.  SKIN: No obvious rash, lesion, or ulcer.   LABORATORY PANEL:   CBC Recent Labs  Lab 07/13/18 0237  WBC 8.0  HGB 8.7*  HCT 28.2*  PLT 144*   ------------------------------------------------------------------------------------------------------------------ Chemistries  Recent Labs  Lab 07/10/18 2251  07/13/18 0237  NA 140   < > 138  K 3.2*   < > 3.7  CL 104   < > 105  CO2 27   < > 28  GLUCOSE 132*   < > 132*  BUN 28*   < > 21  CREATININE 1.03*   < > 0.87  CALCIUM 9.4   < > 8.4*  AST 59*  --   --   ALT 13  --   --   ALKPHOS 62  --   --   BILITOT 0.6  --   --    < > = values in this interval not displayed.   ------------------------------------------------------------------------------------------------------------------  Cardiac Enzymes No results for input(s): TROPONINI in  the last 168 hours. ------------------------------------------------------------------------------------------------------------------  RADIOLOGY:  Dg Hip Operative Unilat W Or W/o Pelvis Right  Result Date: 07/11/2018 CLINICAL DATA:  Hip pain EXAM: OPERATIVE right HIP (WITH PELVIS IF PERFORMED) 7 VIEWS TECHNIQUE: Fluoroscopic spot image(s) were submitted for interpretation post-operatively. COMPARISON:  07/10/2018 FINDINGS: The patient has undergone intramedullary nail placement of  the right femur. The alignment appears significantly improved. There is no unexpected radiopaque foreign body. IMPRESSION: Status post ORIF of the right hip. Electronically Signed   By: Katherine Mantlehristopher  Green M.D.   On: 07/11/2018 19:44     ASSESSMENT AND PLAN:  85 year old female patient with history of hypertension currently under hospitalist service for mechanical fall  -Right hip fracture Orthopedic surgery follow-up appreciated Status post ORIF procedure Postop day 2 Physical therapy formal evaluation  -Right hip pain Continue oral narcotics and PRN morphine for pain management  -Questionable GI bleed Hemoglobin 8.7 Switch to oral Protonix GI follow-up Monitor hemoglobin hematocrit  -Postoperative anemia Continue iron supplements Monitor hemoglobin hematocrit  -Hypertension Medical management  -DVT prophylaxis sequential compression device to lower extremities and Lovenox subcutaneously  All the records are reviewed and case discussed with Care Management/Social Worker. Management plans discussed with the patient, family and they are in agreement.  CODE STATUS: Full code  DVT Prophylaxis: SCDs  TOTAL TIME TAKING CARE OF THIS PATIENT: 37 minutes.   POSSIBLE D/C IN 2 to 3 DAYS, DEPENDING ON CLINICAL CONDITION.  Ihor AustinPavan Amilia Vandenbrink M.D on 07/13/2018 at 10:27 AM  Between 7am to 6pm - Pager - [phone]  After 6pm go to [email or web address] - password [email or web address] [facility]  [facility]  Office  [phone]  CC: Primary care physician; Barbette ReichmannHande, Vishwanath, MD  Note: This dictation was prepared with Dragon dictation along with smaller phrase technology. Any transcriptional errors that result from this process are unintentional.

## 2018-07-13 NOTE — Progress Notes (Signed)
Physical Therapy Treatment Patient Details Name: Cindy PikeHattie M Rod MRN: [id number] DOB: 06/02/1926 Today's Date: 07/13/2018    History of Present Illness admitted for acute hospitalization s/p mechanical fall with R hip pain; admitted for R hip subtrochanteric hip fracture s/p ORIF with IM rod (6/1), WBAT    PT Comments    Continues to require extensive assist +2 for all mobility tasks--supine/sit, sit/stand, standing balance and gait efforts. Poor standing balance, limited weight acceptance R LE (tends to maintain ankle PF and LE anterior to BOS); consistently flexing at trunk with all standing efforts.  Dep assist to advance LEs; unsafe/unable to attempt gait further. Strongly recommend transition to STR at discharge; absolutely unsafe/unable to return home alone at this time.  Though patient speak of wanting to go home, question ability to fully understand situation, implications and overall safety concerns associated with returning home at this time.  RNCM informed/aware.    Follow Up Recommendations  SNF     Equipment Recommendations       Recommendations for Other Services       Precautions / Restrictions Precautions Precautions: Fall Restrictions Weight Bearing Restrictions: Yes RLE Weight Bearing: Weight bearing as tolerated    Mobility  Bed Mobility Overal bed mobility: Needs Assistance Bed Mobility: Supine to Sit     Supine to sit: Max assist;+2 for safety/equipment     General bed mobility comments: poor dissociation of trunk and extremities  Transfers Overall transfer level: Needs assistance Equipment used: Rolling walker (2 wheeled) Transfers: Sit to/from Stand Sit to Stand: Max assist;+2 physical assistance         General transfer comment: max assist for lift off, standing balance, postural extension; difficulty coordinating functional movement task  Ambulation/Gait             General Gait Details: unable to weight shift, coordinate  extremities for advancement of either extremity for safe gait efforts.   Stairs             Wheelchair Mobility    Modified Rankin (Stroke Patients Only)       Balance Overall balance assessment: Needs assistance Sitting-balance support: No upper extremity supported;Feet supported Sitting balance-Leahy Scale: Fair     Standing balance support: Bilateral upper extremity supported Standing balance-Leahy Scale: Zero                              Cognition Arousal/Alertness: Awake/alert Behavior During Therapy: WFL for tasks assessed/performed Overall Cognitive Status: No family/caregiver present to determine baseline cognitive functioning                                 General Comments: remains unaware of situation, recent surgery, associated deficits and level of assist      Exercises      General Comments        Pertinent Vitals/Pain Pain Assessment: Faces Faces Pain Scale: Hurts even more Pain Location: R hip Pain Descriptors / Indicators: Aching;Grimacing;Guarding Pain Intervention(s): Limited activity within patient's tolerance;Monitored during session;Repositioned    Home Living                      Prior Function            PT Goals (current goals can now be found in the care plan section) Acute Rehab PT Goals Patient Stated Goal: to work with therapist PT Goal  Formulation: With patient Time For Goal Achievement: 07/26/18 Potential to Achieve Goals: Fair Progress towards PT goals: Not progressing toward goals - comment    Frequency    BID      PT Plan Current plan remains appropriate    Co-evaluation              AM-PAC PT "6 Clicks" Mobility   Outcome Measure  Help needed turning from your back to your side while in a flat bed without using bedrails?: Total Help needed moving from lying on your back to sitting on the side of a flat bed without using bedrails?: Total Help needed moving to  and from a bed to a chair (including a wheelchair)?: Total Help needed standing up from a chair using your arms (e.g., wheelchair or bedside chair)?: Total Help needed to walk in hospital room?: Total Help needed climbing 3-5 steps with a railing? : Total 6 Click Score: 6    End of Session Equipment Utilized During Treatment: Gait belt Activity Tolerance: Patient limited by pain Patient left: in chair;with call bell/phone within reach;with chair alarm set Nurse Communication: Mobility status PT Visit Diagnosis: Muscle weakness (generalized) (M62.81);Difficulty in walking, not elsewhere classified (R26.2)     Time: 8786-7672 PT Time Calculation (min) (ACUTE ONLY): 18 min  Charges:  $Therapeutic Activity: 8-22 mins                     Harshith Pursell H. Manson Passey, PT, DPT, NCS 07/13/18, 1:37 PM [phone]

## 2018-07-13 NOTE — TOC Progression Note (Signed)
Transition of Care [facility]) - Progression Note    Patient Details  Name: Cindy Meyer MRN: [id number] Date of Birth: 17-Jan-1927  Transition of Care [facility]) CM/SW Joshua, RN Phone Number: 07/13/2018, 10:44 AM  Clinical Narrative:    Met with the patient again to discuss [place] and RW need, she agrees to home health and a RW, she stated she knows she needs them She has no preference fo which company and asked that I call her daughter Deberah Castle to ask her recommendations, I called her daughter from the room and there was no answer, I also called her grand daughter from the room and got no answer, the patient states that [facility] would be fine to set up , I notified Santiago Glad with [facility] of the referral. I notified brad with [employer] about the need for the RW     Expected Discharge Plan: Skilled Nursing Facility Barriers to Discharge: Continued Medical Work up  Expected Discharge Plan and Services Expected Discharge Plan: [place]   Discharge Planning Services: CM Consult Post Acute Care Choice: Home Health   Expected Discharge Date: 07/14/18               DME Arranged: Patient refused services         HH Arranged: Patient Refused [place]           Social Determinants of Health (SDOH) Interventions    Readmission Risk Interventions No flowsheet data found.

## 2018-07-13 NOTE — Progress Notes (Signed)
Subjective: 2 Days Post-Op Procedure(s) (LRB): INTRAMEDULLARY (IM) NAIL INTERTROCHANTRIC RIGHT LONG AFFIXUS (Right) Patient reports pain as mild.   Patient is well, and has had no acute complaints or problems Denies any CP, SOB, ABD pain. Continue with PT today, patient is currently refusing SNF.  Will continue to monitor.  Objective: Vital signs in last 24 hours: Temp:  [98 F (36.7 C)-99.1 F (37.3 C)] 98.6 F (37 C) (06/03 0720) Pulse Rate:  [90-112] 111 (06/03 0724) Resp:  [16-19] 19 (06/03 0720) BP: (125-159)/(63-130) 130/72 (06/03 0724) SpO2:  [100 %] 100 % (06/03 0720)  Intake/Output from previous day: 06/02 0701 - 06/03 0700 In: 1725.5 [P.O.:720; I.V.:1005.5] Out: 700 [Urine:700] Intake/Output this shift: No intake/output data recorded.  Recent Labs    07/10/18 2251 07/11/18 0456 07/12/18 0310 07/13/18 0237  HGB 12.0 10.8* 9.7* 8.7*   Recent Labs    07/12/18 0310 07/13/18 0237  WBC 7.6 8.0  RBC 3.39* 3.07*  HCT 30.4* 28.2*  PLT 162 144*   Recent Labs    07/12/18 0310 07/13/18 0237  NA 139 138  K 4.0 3.7  CL 104 105  CO2 27 28  BUN 24* 21  CREATININE 1.02* 0.87  GLUCOSE 151* 132*  CALCIUM 8.6* 8.4*   Recent Labs    07/11/18 0859  INR 1.0    EXAM General - Patient is Alert, Appropriate and Oriented Extremity - Neurovascular intact Sensation intact distally Intact pulses distally Dorsiflexion/Plantar flexion intact No cellulitis present Compartment soft Dressing - Intact honeycomb dressing to the right leg, scant drainage to the proximal dressing. Motor Function - intact, moving foot and toes well on exam.   Past Medical History:  Diagnosis Date  . Hypertension     Assessment/Plan:   2 Days Post-Op Procedure(s) (LRB): INTRAMEDULLARY (IM) NAIL INTERTROCHANTRIC RIGHT LONG AFFIXUS (Right) Principal Problem:   Hip fracture (HCC) Active Problems:   HTN (hypertension)  Estimated body mass index is 17.75 kg/m as calculated from the  following:   Height as of this encounter: 5\' 6"  (1.676 m).   Weight as of this encounter: 49.9 kg. Advance diet Up with therapy   Needs bowel movement, reports that she is passing gas. Labs reviewed this AM, Hg 8.7. HR 111 this morning.  Continue to monitor.  CBC ordered for tomorrow morning Care management to assist with discharge planning, current plan is for d/c to SNF however patient is refusing at this time.  Skilled nursing facility to remove staples on 07/25/2018 TED hose bilateral lower extremities x6 weeks Lovenox 30 mg daily x14 days Follow-up with [facility] orthopedics in 6 weeks for x-rays  DVT Prophylaxis - Lovenox, TED hose and SCDs Weight-Bearing as tolerated to right leg   J. Horris Latino, PA-C [facility] 07/13/2018, 7:54 AM

## 2018-07-14 LAB — CBC
HCT: 28.3 % — ABNORMAL LOW (ref 36.0–46.0)
Hemoglobin: 8.7 g/dL — ABNORMAL LOW (ref 12.0–15.0)
MCH: 28.2 pg (ref 26.0–34.0)
MCHC: 30.7 g/dL (ref 30.0–36.0)
MCV: 91.6 fL (ref 80.0–100.0)
Platelets: 149 10*3/uL — ABNORMAL LOW (ref 150–400)
RBC: 3.09 MIL/uL — ABNORMAL LOW (ref 3.87–5.11)
RDW: 13.6 % (ref 11.5–15.5)
WBC: 8.9 10*3/uL (ref 4.0–10.5)
nRBC: 0 % (ref 0.0–0.2)

## 2018-07-14 LAB — BASIC METABOLIC PANEL
Anion gap: 7 (ref 5–15)
BUN: 18 mg/dL (ref 8–23)
CO2: 31 mmol/L (ref 22–32)
Calcium: 8.9 mg/dL (ref 8.9–10.3)
Chloride: 102 mmol/L (ref 98–111)
Creatinine, Ser: 0.67 mg/dL (ref 0.44–1.00)
GFR calc Af Amer: >60 mL/min (ref >60)
GFR calc non Af Amer: >60 mL/min (ref >60)
Glucose, Bld: 122 mg/dL — ABNORMAL HIGH (ref 70–99)
Potassium: 4 mmol/L (ref 3.5–5.1)
Sodium: 140 mmol/L (ref 135–145)

## 2018-07-14 MED ORDER — DOCUSATE SODIUM 100 MG PO CAPS: 100.0000 mg | ORAL_CAPSULE | Freq: Two times a day (BID) | ORAL | 0 refills | Status: AC

## 2018-07-14 MED ORDER — FE FUMARATE-B12-VIT C-FA-IFC PO CAPS: 1.0000 | ORAL_CAPSULE | Freq: Two times a day (BID) | ORAL | Status: AC

## 2018-07-14 MED ORDER — BISACODYL 10 MG RE SUPP: 10.0000 mg | Freq: Every day | RECTAL | 0 refills | Status: AC | PRN

## 2018-07-14 MED ORDER — MAGNESIUM HYDROXIDE 400 MG/5ML PO SUSP: 30.0000 mL | Freq: Every day | ORAL | 0 refills | Status: AC | PRN

## 2018-07-14 NOTE — Progress Notes (Signed)
Physical Therapy Treatment Patient Details Name: Cindy Meyer MRN: [id number] DOB: 1926-02-21 Today's Date: 07/14/2018    History of Present Illness admitted for acute hospitalization s/p mechanical fall with R hip pain; admitted for R hip subtrochanteric hip fracture s/p ORIF with IM rod (6/1), WBAT    PT Comments    Pt presents with deficits in strength, transfers, mobility, gait, balance, and activity tolerance.  Pt near total assist for all functional tasks at this time and is highly limited by R hip pain with movement.  Once in standing pt was able to maintain static standing balance with occasional min A and max verbal cues for upright posture.  Pt was actively participated in the below therex but ultimately was very limited functionally.  Pt will benefit from PT services in a SNF setting upon discharge to safely address above deficits for decreased caregiver assistance and eventual return to PLOF.     Follow Up Recommendations  SNF     Equipment Recommendations  Other (comment)(TBD at next venue of care)    Recommendations for Other Services       Precautions / Restrictions Precautions Precautions: Fall Restrictions Weight Bearing Restrictions: Yes RLE Weight Bearing: Weight bearing as tolerated    Mobility  Bed Mobility Overal bed mobility: Needs Assistance Bed Mobility: Sit to Supine;Supine to Sit     Supine to sit: Total assist;+2 for physical assistance Sit to supine: Total assist;+2 for physical assistance   General bed mobility comments: Pt provided little to no effort with bed mobility tasks mostly limited by pain   Transfers Overall transfer level: Needs assistance Equipment used: Bilateral platform walker Transfers: Sit to/from Stand Sit to Stand: Max assist;+2 physical assistance         General transfer comment: Max assist to stand with max verbal and tactile cues for upright posture but with pt able to maintain standing balance with min A once  in standing.   Ambulation/Gait             General Gait Details: Pt unable to advance either LE this session.    Stairs             Wheelchair Mobility    Modified Rankin (Stroke Patients Only)       Balance Overall balance assessment: Needs assistance Sitting-balance support: No upper extremity supported;Feet supported Sitting balance-Leahy Scale: Fair   Postural control: Posterior lean Standing balance support: Bilateral upper extremity supported Standing balance-Leahy Scale: Poor                              Cognition Arousal/Alertness: Awake/alert Behavior During Therapy: WFL for tasks assessed/performed Overall Cognitive Status: No family/caregiver present to determine baseline cognitive functioning                                        Exercises Total Joint Exercises Ankle Circles/Pumps: AROM;Both;10 reps;15 reps Quad Sets: Strengthening;Both;10 reps;15 reps Towel Squeeze: Strengthening;Both;5 reps Heel Slides: AAROM;Both;10 reps;Other (comment)(Small amplitude on the RLE) Hip ABduction/ADduction: AAROM;Both;10 reps Straight Leg Raises: AAROM;Both;10 reps Long Arc Quad: AROM;Both;10 reps Knee Flexion: AROM;Both;10 reps Other Exercises Other Exercises: Standing mini squats x 5 with small amplitude Other Exercises: Lateral weight shifting in standing left/right    General Comments        Pertinent Vitals/Pain Pain Assessment: No/denies pain    Home  Living                      Prior Function            PT Goals (current goals can now be found in the care plan section)      Frequency    BID      PT Plan Current plan remains appropriate    Co-evaluation              AM-PAC PT "6 Clicks" Mobility   Outcome Measure  Help needed turning from your back to your side while in a flat bed without using bedrails?: Total Help needed moving from lying on your back to sitting on the side of a  flat bed without using bedrails?: Total Help needed moving to and from a bed to a chair (including a wheelchair)?: Total Help needed standing up from a chair using your arms (e.g., wheelchair or bedside chair)?: Total Help needed to walk in hospital room?: Total Help needed climbing 3-5 steps with a railing? : Total 6 Click Score: 6    End of Session Equipment Utilized During Treatment: Gait belt Activity Tolerance: Patient limited by pain Patient left: with bed alarm set;in bed;with call bell/phone within reach;Other (comment)(Nursing notified of no call bell set up in pt's room) Nurse Communication: Mobility status PT Visit Diagnosis: Muscle weakness (generalized) (M62.81);Difficulty in walking, not elsewhere classified (R26.2)     Time: 1026-1050 PT Time Calculation (min) (ACUTE ONLY): 24 min  Charges:  $Therapeutic Exercise: 8-22 mins $Therapeutic Activity: 8-22 mins                     D. Scott Bernis Schreur PT, DPT 07/14/18, 1:20 PM

## 2018-07-14 NOTE — TOC Progression Note (Signed)
Transition of Care [facility]) - Progression Note    Patient Details  Name: Cindy Meyer MRN: [id number] Date of Birth: August 19, 1926  Transition of Care [facility]) CM/SW Contact  Barrie Dunker, RN Phone Number: 07/14/2018, 8:47 AM  Clinical Narrative:     Daughter Dara Lords called me this morning and told me that they had a conversation on the phone with the patient and that the patient agrees she is unable to go home at this time.  The daughter stated that they have had conversations with multiple family members including the patients sons and they are all on board with the patient going to rehab at [place]   Expected Discharge Plan: Skilled Nursing Facility Barriers to Discharge: Continued Medical Work up  Expected Discharge Plan and Services Expected Discharge Plan: Skilled Nursing Facility   Discharge Planning Services: CM Consult Post Acute Care Choice: Home Health   Expected Discharge Date: 07/14/18               DME Arranged: 3-N-1 DME Agency: [employer] Date DME Agency Contacted: 07/13/18 Time DME Agency Contacted: [phone] Representative spoke with at DME Agency: Nida Boatman HH Arranged: PT, Nurse's Aide HH Agency: [facility] Date HH Agency Contacted: 07/13/18 Time HH Agency Contacted: 1050 Representative spoke with at [facility] Agency: Clydie Braun   Social Determinants of Health (SDOH) Interventions    Readmission Risk Interventions No flowsheet data found.

## 2018-07-14 NOTE — Progress Notes (Signed)
   Subjective: 3 Days Post-Op Procedure(s) (LRB): INTRAMEDULLARY (IM) NAIL INTERTROCHANTRIC RIGHT LONG AFFIXUS (Right) Patient reports pain as mild.   Patient is well, and has had no acute complaints or problems Denies any CP, SOB, ABD pain. We will continue with PT today   Objective: Vital signs in last 24 hours: Temp:  [98.3 F (36.8 C)-99.2 F (37.3 C)] 99.2 F (37.3 C) (06/04 0756) Pulse Rate:  [103-110] 110 (06/04 0756) Resp:  [16-20] 17 (06/04 0756) BP: (120-132)/(67-99) 120/70 (06/04 0756) SpO2:  [98 %-100 %] 98 % (06/04 0756)  Intake/Output from previous day: 06/03 0701 - 06/04 0700 In: 480 [P.O.:480] Out: 1050 [Urine:1050] Intake/Output this shift: No intake/output data recorded.  Recent Labs    07/12/18 0310 07/13/18 0237 07/14/18 0358  HGB 9.7* 8.7* 8.7*   Recent Labs    07/13/18 0237 07/14/18 0358  WBC 8.0 8.9  RBC 3.07* 3.09*  HCT 28.2* 28.3*  PLT 144* 149*   Recent Labs    07/13/18 0237 07/14/18 0358  NA 138 140  K 3.7 4.0  CL 105 102  CO2 28 31  BUN 21 18  CREATININE 0.87 0.67  GLUCOSE 132* 122*  CALCIUM 8.4* 8.9   Recent Labs    07/11/18 0859  INR 1.0    EXAM General - Patient is Alert, Appropriate and Oriented Extremity - Neurovascular intact Sensation intact distally Intact pulses distally Dorsiflexion/Plantar flexion intact No cellulitis present Compartment soft Dressing - dressing C/D/I and moderate drainage Motor Function - intact, moving foot and toes well on exam.   Past Medical History:  Diagnosis Date  . Hypertension     Assessment/Plan:   3 Days Post-Op Procedure(s) (LRB): INTRAMEDULLARY (IM) NAIL INTERTROCHANTRIC RIGHT LONG AFFIXUS (Right) Principal Problem:   Hip fracture (HCC) Active Problems:   HTN (hypertension)  Estimated body mass index is 17.75 kg/m as calculated from the following:   Height as of this encounter: 5\' 6"  (1.676 m).   Weight as of this encounter: 49.9 kg. Advance diet Up with  therapy  Needs bowel movement Hemoglobin 8.7.  Continue with iron supplementation. Care manager to assist with discharge  Skilled nursing facility to remove staples on 07/25/2018 TED hose bilateral lower extremities x6 weeks Lovenox 30 mg daily x14 days Follow-up with [facility] orthopedics in 6 weeks for x-rays  DVT Prophylaxis - Lovenox, TED hose and SCDs Weight-Bearing as tolerated to right leg   T. Cranston Neighbor, PA-C [facility] 07/14/2018, 8:46 AM

## 2018-07-14 NOTE — TOC Transition Note (Signed)
Transition of Care [facility]) - CM/SW Discharge Note   Patient Details  Name: Cindy Meyer MRN: [id number] Date of Birth: 06-29-1926  Transition of Care [facility]) CM/SW Contact:  Barrie Dunker, RN Phone Number: 07/14/2018, 1:27 PM   Clinical Narrative:     Patient to discharge today to [facility] room 513, Notified Dara Lords the daughter of the room number and gave the phone number to call and get set up to do the paperwork, I explained the patient would transport via EMS but I did not have an exact time as it is non urgent and it would depend upon EMS getting her to take her,  She stated understanding The nurse is to call report to [phone] when ready, and to call EMS to tranport the patient. Discharge packet on the chart  Final next level of care: Skilled Nursing Facility Barriers to Discharge: Barriers Resolved   Patient Goals and CMS Choice Patient states their goals for this hospitalization and ongoing recovery are:: to go home CMS Medicare.gov Compare Post Acute Care list provided to:: Patient Choice offered to / list presented to : Patient  Discharge Placement                       Discharge Plan and Services   Discharge Planning Services: CM Consult Post Acute Care Choice: Home Health          DME Arranged: 3-N-1 DME Agency: [employer] Date DME Agency Contacted: 07/13/18 Time DME Agency Contacted: 1056 Representative spoke with at DME Agency: Nida Boatman HH Arranged: PT, Nurse's Aide HH Agency: [facility] Date HH Agency Contacted: 07/13/18 Time HH Agency Contacted: 1050 Representative spoke with at [facility] Agency: Clydie Braun  Social Determinants of Health (SDOH) Interventions     Readmission Risk Interventions No flowsheet data found.

## 2018-07-14 NOTE — Discharge Instructions (Signed)
TED hose bilateral lower extremities x6 weeks Lovenox 30 mg daily x14 days

## 2018-07-14 NOTE — Discharge Summary (Signed)
[facility] [place] [facility]   PATIENT NAME: Cindy KettleHattie Meyer    MR#:  [id number]  DATE OF BIRTH:  10/15/1926  DATE OF ADMISSION:  07/10/2018   ADMITTING PHYSICIAN: Oralia Manisavid Willis, MD  DATE OF DISCHARGE: 07/14/2018  PRIMARY CARE PHYSICIAN: Barbette ReichmannHande, Vishwanath, MD   ADMISSION DIAGNOSIS:  Closed fracture of right hip, initial encounter (HCC) [S72.001A] Hip fx, right, closed, initial encounter (HCC) [S72.001A] DISCHARGE DIAGNOSIS:  Principal Problem:   Hip fracture (HCC) Active Problems:   HTN (hypertension)  SECONDARY DIAGNOSIS:   Past Medical History:  Diagnosis Date  . Hypertension    HOSPITAL COURSE:  85 year old female patient with history of hypertension currently under hospitalist service for mechanical fall  -Right hip fracture Orthopedic surgery follow-up appreciated Status post ORIF procedure Postop day 3.  Skilled nursing facility to remove staples on 07/25/2018 TED hose bilateral lower extremities x6 weeks Lovenox 30 mg daily x14 days [facility] in 6 weeks for x-rays ED evaluation still suggest skilled nursing facility placement.  -Right hip pain Continue oral narcotics for pain management  -Questionable GI bleed Hemoglobin 8.7, stable. Switch to oral Protonix  -Postoperative anemia Stable.  -Hypertension Medical management DISCHARGE CONDITIONS:  Stable, discharge to nursing home today. CONSULTS OBTAINED:  Treatment Team:  Kennedy BuckerMenz, Michael, MD DRUG ALLERGIES:  No Known Allergies DISCHARGE MEDICATIONS:   Allergies as of 07/14/2018   No Known Allergies     Medication List    STOP taking these medications   multivitamin with minerals Tabs tablet     TAKE these medications   bisacodyl 10 MG suppository Commonly known as:  DULCOLAX Place 1 suppository (10 mg total) rectally daily as needed for moderate constipation.   docusate sodium 100 MG capsule Commonly known as:  COLACE Take 1 capsule (100 mg  total) by mouth 2 (two) times daily.   enoxaparin 30 MG/0.3ML injection Commonly known as:  LOVENOX Inject 0.3 mLs (30 mg total) into the skin daily for 14 days.   ferrous fumarate-b12-vitamic C-folic acid capsule Commonly known as:  TRINSICON / FOLTRIN Take 1 capsule by mouth 2 (two) times daily after a meal.   hydrochlorothiazide 12.5 MG tablet Commonly known as:  HYDRODIURIL Take 12.5 mg by mouth daily.   magnesium hydroxide 400 MG/5ML suspension Commonly known as:  MILK OF MAGNESIA Take 30 mLs by mouth daily as needed for mild constipation.   oxyCODONE 5 MG immediate release tablet Commonly known as:  Oxy IR/ROXICODONE Take 1 tablet (5 mg total) by mouth every 6 (six) hours as needed for moderate pain.        DISCHARGE INSTRUCTIONS:  See AVS. If you experience worsening of your admission symptoms, develop shortness of breath, life threatening emergency, suicidal or homicidal thoughts you must seek medical attention immediately by calling 911 or calling your MD immediately  if symptoms less severe.  You Must read complete instructions/literature along with all the possible adverse reactions/side effects for all the Medicines you take and that have been prescribed to you. Take any new Medicines after you have completely understood and accpet all the possible adverse reactions/side effects.   Please note  You were cared for by a hospitalist during your hospital stay. If you have any questions about your discharge medications or the care you received while you were in the hospital after you are discharged, you can call the unit and asked to speak with the hospitalist on call if the hospitalist that took care of you is  not available. Once you are discharged, your primary care physician will handle any further medical issues. Please note that NO REFILLS for any discharge medications will be authorized once you are discharged, as it is imperative that you return to your primary care  physician (or establish a relationship with a primary care physician if you do not have one) for your aftercare needs so that they can reassess your need for medications and monitor your lab values.    On the day of Discharge:  VITAL SIGNS:  Blood pressure 120/70, pulse (!) 110, temperature 99.2 F (37.3 C), temperature source Oral, resp. rate 17, height 5\' 6"  (1.676 m), weight 49.9 kg, SpO2 98 %. PHYSICAL EXAMINATION:  GENERAL:  85 y.o.-year-old patient lying in the bed with no acute distress.  EYES: Pupils equal, round, reactive to light and accommodation. No scleral icterus. Extraocular muscles intact.  HEENT: Head atraumatic, normocephalic. Oropharynx and nasopharynx clear.  NECK:  Supple, no jugular venous distention. No thyroid enlargement, no tenderness.  LUNGS: Normal breath sounds bilaterally, no wheezing, rales,rhonchi or crepitation. No use of accessory muscles of respiration.  CARDIOVASCULAR: S1, S2 normal. No murmurs, rubs, or gallops.  ABDOMEN: Soft, non-tender, non-distended. Bowel sounds present. No organomegaly or mass.  EXTREMITIES: No pedal edema, cyanosis, or clubbing.  NEUROLOGIC: Cranial nerves II through XII are intact. Muscle strength 4/5 in all extremities. Sensation intact. Gait not checked.  PSYCHIATRIC: The patient is alert and oriented x 3.  SKIN: No obvious rash, lesion, or ulcer.  DATA REVIEW:   CBC Recent Labs  Lab 07/14/18 0358  WBC 8.9  HGB 8.7*  HCT 28.3*  PLT 149*    Chemistries  Recent Labs  Lab 07/10/18 2251  07/14/18 0358  NA 140   < > 140  K 3.2*   < > 4.0  CL 104   < > 102  CO2 27   < > 31  GLUCOSE 132*   < > 122*  BUN 28*   < > 18  CREATININE 1.03*   < > 0.67  CALCIUM 9.4   < > 8.9  AST 59*  --   --   ALT 13  --   --   ALKPHOS 62  --   --   BILITOT 0.6  --   --    < > = values in this interval not displayed.     Microbiology Results  Results for orders placed or performed during the hospital encounter of 07/10/18  SARS  Coronavirus 2 (CEPHEID - Performed in [facility] hospital lab), Hosp Order     Status: None   Collection Time: 07/10/18 11:33 PM  Result Value Ref Range Status   SARS Coronavirus 2 NEGATIVE NEGATIVE Final    Comment: (NOTE) If result is NEGATIVE SARS-CoV-2 target nucleic acids are NOT DETECTED. The SARS-CoV-2 RNA is generally detectable in upper and lower  respiratory specimens during the acute phase of infection. The lowest  concentration of SARS-CoV-2 viral copies this assay can detect is 250  copies / mL. A negative result does not preclude SARS-CoV-2 infection  and should not be used as the sole basis for treatment or other  patient management decisions.  A negative result may occur with  improper specimen collection / handling, submission of specimen other  than nasopharyngeal swab, presence of viral mutation(s) within the  areas targeted by this assay, and inadequate number of viral copies  (<250 copies / mL). A negative result must be combined with clinical  observations, patient history, and epidemiological information. If result is POSITIVE SARS-CoV-2 target nucleic acids are DETECTED. The SARS-CoV-2 RNA is generally detectable in upper and lower  respiratory specimens dur ing the acute phase of infection.  Positive  results are indicative of active infection with SARS-CoV-2.  Clinical  correlation with patient history and other diagnostic information is  necessary to determine patient infection status.  Positive results do  not rule out bacterial infection or co-infection with other viruses. If result is PRESUMPTIVE POSTIVE SARS-CoV-2 nucleic acids MAY BE PRESENT.   A presumptive positive result was obtained on the submitted specimen  and confirmed on repeat testing.  While 2019 novel coronavirus  (SARS-CoV-2) nucleic acids may be present in the submitted sample  additional confirmatory testing may be necessary for epidemiological  and / or clinical management purposes  to  differentiate between  SARS-CoV-2 and other Sarbecovirus currently known to infect humans.  If clinically indicated additional testing with an alternate test  methodology [phone]) is advised. The SARS-CoV-2 RNA is generally  detectable in upper and lower respiratory sp ecimens during the acute  phase of infection. The expected result is Negative. Fact Sheet for Patients:  BoilerBrush.com.cy Fact Sheet for Healthcare Providers: https://pope.com/ This test is not yet approved or cleared by the [place] FDA and has been authorized for detection and/or diagnosis of SARS-CoV-2 by FDA under an Emergency Use Authorization (EUA).  This EUA will remain in effect (meaning this test can be used) for the duration of the COVID-19 declaration under Section 564(b)(1) of the Act, 21 U.S.C. section 360bbb-3(b)(1), unless the authorization is terminated or revoked sooner. Performed at [facility], [address] [place], [place] [address]   Surgical PCR screen     Status: None   Collection Time: 07/11/18  5:51 AM  Result Value Ref Range Status   MRSA, PCR NEGATIVE NEGATIVE Final   Staphylococcus aureus NEGATIVE NEGATIVE Final    Comment: (NOTE) The Xpert SA Assay (FDA approved for NASAL specimens in patients 80 years of age and older), is one component of a comprehensive surveillance program. It is not intended to diagnose infection nor to guide or monitor treatment. Performed at [facility], [address], [place], [place] [address]     RADIOLOGY:  No results found.   Management plans discussed with the patient, family and they are in agreement.  CODE STATUS: Full Code   TOTAL TIME TAKING CARE OF THIS PATIENT: 32 minutes.    Shaune Pollack M.D on 07/14/2018 at 1:16 PM  Between 7am to 6pm - Pager - [phone]  After 6pm go to [email or web address] - Social research officer, government  [facility] [place] [facility]  Office   [phone]  CC: Primary care physician; Barbette Reichmann, MD   Note: This dictation was prepared with Meyer dictation along with smaller phrase technology. Any transcriptional errors that result from this process are unintentional.

## 2018-07-14 NOTE — Progress Notes (Signed)
Report called and given to [facility] at [facility]. IVs removed. Stable condition. RN about to call transport.

## 2018-07-14 NOTE — TOC Progression Note (Signed)
Transition of Care [facility]) - Progression Note    Patient Details  Name: MILCAH PLUFF MRN: [id number] Date of Birth: 23-Jan-1927  Transition of Care [facility] CM/SW Contact  Barrie Dunker, RN Phone Number: 07/14/2018, 1:07 PM  Clinical Narrative:     Spoke with the daughter Dara Lords and she has accepted the bed offer from [facility] at [facility] of the acceptance of the bed offer, she stated that they can take the patient today if Dcd  Expected Discharge Plan: Skilled Nursing Facility Barriers to Discharge: Continued Medical Work up  Expected Discharge Plan and Services Expected Discharge Plan: Skilled Nursing Facility   Discharge Planning Services: CM Consult Post Acute Care Choice: Home Health   Expected Discharge Date: 07/14/18               DME Arranged: 3-N-1 DME Agency: [facility] Date DME Agency Contacted: 07/13/18 Time DME Agency Contacted: [phone] Representative spoke with at DME Agency: Nida Boatman HH Arranged: PT, Nurse's Aide HH Agency: [facility] Date HH Agency Contacted: 07/13/18 Time HH Agency Contacted: 1050 Representative spoke with at [facility] Agency: Clydie Braun   Social Determinants of Health (SDOH) Interventions    Readmission Risk Interventions No flowsheet data found.

## 2018-07-14 NOTE — Progress Notes (Signed)
EMS is here to take patient to [facility]. Report was given.

## 2018-07-14 NOTE — Care Management Important Message (Signed)
Important Message  Patient Details  Name: Cindy Meyer MRN: [id number] Date of Birth: [birth year]   Medicare Important Message Given:  Yes    Cindy Meyer 07/14/2018, 7:26 AM

## 2018-07-19 NOTE — Anesthesia Postprocedure Evaluation (Signed)
Anesthesia Post Note  Patient: Cindy Meyer  Procedure(s) Performed: INTRAMEDULLARY (IM) NAIL INTERTROCHANTRIC RIGHT LONG AFFIXUS (Right Hip)  Patient location during evaluation: PACU Anesthesia Type: General Level of consciousness: awake and alert Pain management: pain level controlled Vital Signs Assessment: post-procedure vital signs reviewed and stable Respiratory status: spontaneous breathing, nonlabored ventilation, respiratory function stable and patient connected to nasal cannula oxygen Cardiovascular status: blood pressure returned to baseline and stable Postop Assessment: no apparent nausea or vomiting Anesthetic complications: no     Last Vitals:  Vitals:   07/14/18 1753 07/14/18 1950  BP: (!) 141/88 (!) 141/73  Pulse: (!) 110 91  Resp: 16 19  Temp: 37 C 37.2 C  SpO2: 92% 99%    Last Pain:  Vitals:   07/14/18 1950  TempSrc: Oral  PainSc:                  Molli Barrows

## 2019-02-20 ENCOUNTER — Other Ambulatory Visit: Payer: Self-pay | Admitting: Internal Medicine

## 2019-02-20 DIAGNOSIS — R7401 Elevation of levels of liver transaminase levels: Secondary | ICD-10-CM

## 2019-02-20 DIAGNOSIS — I10 Essential (primary) hypertension: Secondary | ICD-10-CM

## 2019-02-20 DIAGNOSIS — R7989 Other specified abnormal findings of blood chemistry: Secondary | ICD-10-CM

## 2019-02-20 DIAGNOSIS — R945 Abnormal results of liver function studies: Secondary | ICD-10-CM

## 2019-02-27 ENCOUNTER — Ambulatory Visit
Admission: RE | Admit: 2019-02-27 | Discharge: 2019-02-27 | Disposition: A | Discharge status: Home / Self Care | Payer: Medicare Other | Source: Ambulatory Visit | Attending: Internal Medicine | Admitting: Internal Medicine

## 2019-02-27 ENCOUNTER — Other Ambulatory Visit: Payer: Self-pay

## 2019-02-27 DIAGNOSIS — I10 Essential (primary) hypertension: Secondary | ICD-10-CM | POA: Insufficient documentation

## 2019-02-27 DIAGNOSIS — R7401 Elevation of levels of liver transaminase levels: Secondary | ICD-10-CM | POA: Diagnosis present

## 2019-02-27 DIAGNOSIS — R7989 Other specified abnormal findings of blood chemistry: Secondary | ICD-10-CM

## 2019-02-27 DIAGNOSIS — R945 Abnormal results of liver function studies: Secondary | ICD-10-CM

## 2019-06-29 ENCOUNTER — Other Ambulatory Visit: Payer: Self-pay | Admitting: Internal Medicine

## 2019-06-29 ENCOUNTER — Ambulatory Visit: Payer: Medicare Other

## 2019-06-29 ENCOUNTER — Other Ambulatory Visit (HOSPITAL_COMMUNITY): Payer: Self-pay | Admitting: Internal Medicine

## 2019-06-29 DIAGNOSIS — R600 Localized edema: Secondary | ICD-10-CM

## 2019-06-29 DIAGNOSIS — R7989 Other specified abnormal findings of blood chemistry: Secondary | ICD-10-CM

## 2019-07-03 ENCOUNTER — Ambulatory Visit
Admission: RE | Admit: 2019-07-03 | Discharge: 2019-07-03 | Disposition: A | Discharge status: Home / Self Care | Payer: Medicare Other | Source: Ambulatory Visit | Attending: Internal Medicine | Admitting: Internal Medicine

## 2019-07-03 ENCOUNTER — Other Ambulatory Visit: Payer: Self-pay

## 2019-07-03 DIAGNOSIS — R600 Localized edema: Secondary | ICD-10-CM | POA: Diagnosis not present

## 2019-07-14 ENCOUNTER — Ambulatory Visit
Admission: RE | Admit: 2019-07-14 | Discharge: 2019-07-14 | Disposition: A | Discharge status: Home / Self Care | Payer: Medicare Other | Source: Ambulatory Visit | Attending: Internal Medicine | Admitting: Internal Medicine

## 2019-07-14 ENCOUNTER — Other Ambulatory Visit: Payer: Self-pay

## 2019-07-14 DIAGNOSIS — R945 Abnormal results of liver function studies: Secondary | ICD-10-CM | POA: Insufficient documentation

## 2019-07-14 DIAGNOSIS — R7989 Other specified abnormal findings of blood chemistry: Secondary | ICD-10-CM

## 2019-07-14 MED ORDER — IOHEXOL 300 MG/ML  SOLN
75.0000 mL | Freq: Once | INTRAMUSCULAR | Status: AC | PRN
  Administered 2019-07-14: 75 mL via INTRAVENOUS

## 2019-11-14 NOTE — Progress Notes (Signed)
 Subjective  Patient ID: Cindy Meyer is a 86 y.o. female.  86 y/o female here requesting COVID testing. Pt has no known exposure and no symptoms. Pt's daughter wanted her to get tested.    History provided by:  Patient Language interpreter used: No     Review of Systems  Constitutional: Negative for fever.  Respiratory: Negative for cough and shortness of breath.   Cardiovascular: Negative for chest pain.  Gastrointestinal: Negative for vomiting.  Neurological: Negative for headaches.    Patient History   Allergies: No Known Allergies  There is no problem list on file for this patient.  Past Medical History:  Diagnosis Date   Hypertension    History reviewed. No pertinent surgical history. Social History   Socioeconomic History   Marital status: Widowed    Spouse name: Not on file   Number of children: Not on file   Years of education: Not on file   Highest education level: Not on file  Occupational History   Not on file  Tobacco Use   Smoking status: Never Smoker   Smokeless tobacco: Never Used  Vaping Use   Vaping Use: Never used  Substance and Sexual Activity   Alcohol use: Never   Drug use: Never   Sexual activity: Not Currently  Other Topics Concern   Not on file  Social History Narrative   Not on file   No family history on file. Current Outpatient Medications on File Prior to Visit  Medication Sig Dispense Refill   losartan  (Cozaar ) 25 MG tablet Take 25 mg by mouth 1 (one) time each day.     No current facility-administered medications on file prior to visit.    Objective   Vitals:   11/14/19 1109  BP: (!) 150/85  BP Location: Left arm  Patient Position: Sitting  BP Cuff Size: Adult  Pulse: 93  Resp: 18  Temp: 36.6 C (97.9 F)  TempSrc: Tympanic  SpO2: 98%  Weight: 56.2 kg  Height: 5' 6   Physical Exam Vitals reviewed.  Constitutional:      General: She is not in acute distress.    Appearance: Normal  appearance. She is not ill-appearing.  HENT:     Head: Normocephalic.  Eyes:     General: No scleral icterus.    Conjunctiva/sclera: Conjunctivae normal.  Pulmonary:     Effort: Pulmonary effort is normal. No respiratory distress.  Musculoskeletal:     Cervical back: Normal range of motion.  Neurological:     Mental Status: She is alert.     Gait: Gait abnormal (slow).  Psychiatric:        Mood and Affect: Mood normal.        Behavior: Behavior normal.       Results for orders placed or performed in visit on 11/14/19  [facility] Covid 19 Antigen POCT  Component Result   Rapid Covid Negative   Internal Quality Control Pass    Procedures MDM:     1 Acute, uncomplicated illness or injury     Unique ordered tests: One     Assessment requiring historian other than patient: No     Independent visualization of image, tracing, or test: No     Discussion of management with another provider: No     Risk:: Low     Assessment/Plan  Diagnoses and all orders for this visit: Encounter for screening for COVID-19 -     [facility] Covid 19 Antigen POCT

## 2019-11-14 NOTE — Progress Notes (Signed)
 Covid test

## 2021-01-13 IMAGING — RF OPERATIVE RIGHT HIP WITH PELVIS
1 series · 9 of 9 positions shown · non-contrast
Comparison: 07/10/2018

CLINICAL DATA: Hip pain

EXAM:
OPERATIVE right HIP (WITH PELVIS IF PERFORMED) 7 VIEWS
TECHNIQUE: Fluoroscopic spot image(s) were submitted for interpretation
post-operatively.

[Series 1: run · 9 of 9 slices shown]
[im 1/9]
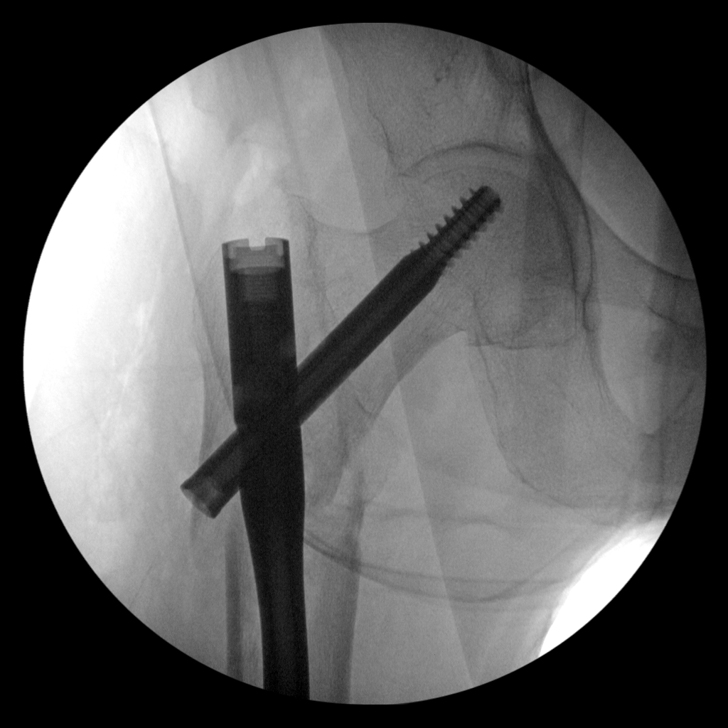
[im 2/9]
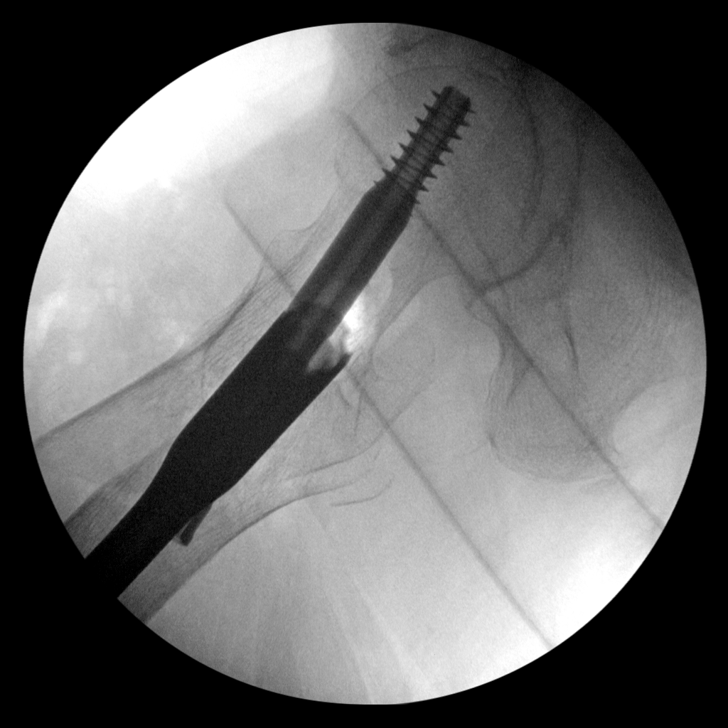
[im 3/9]
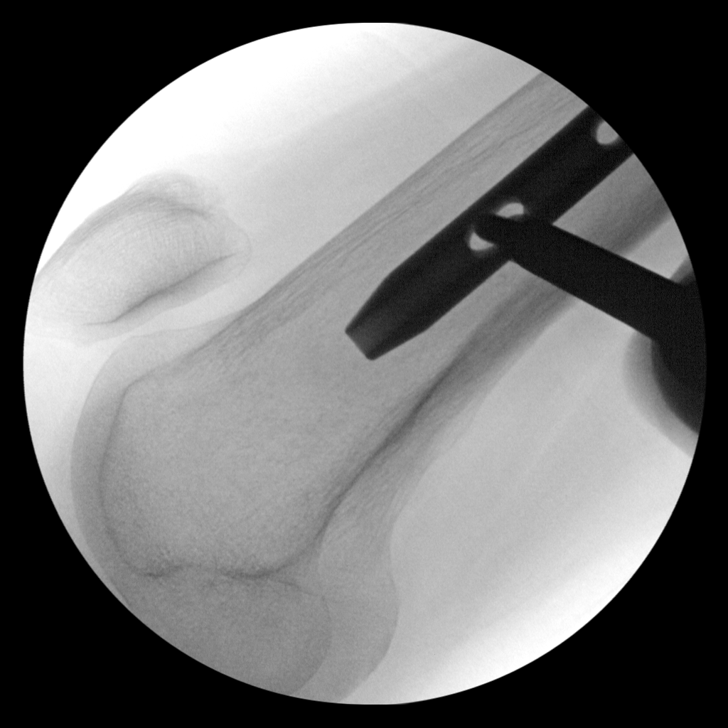
[im 4/9]
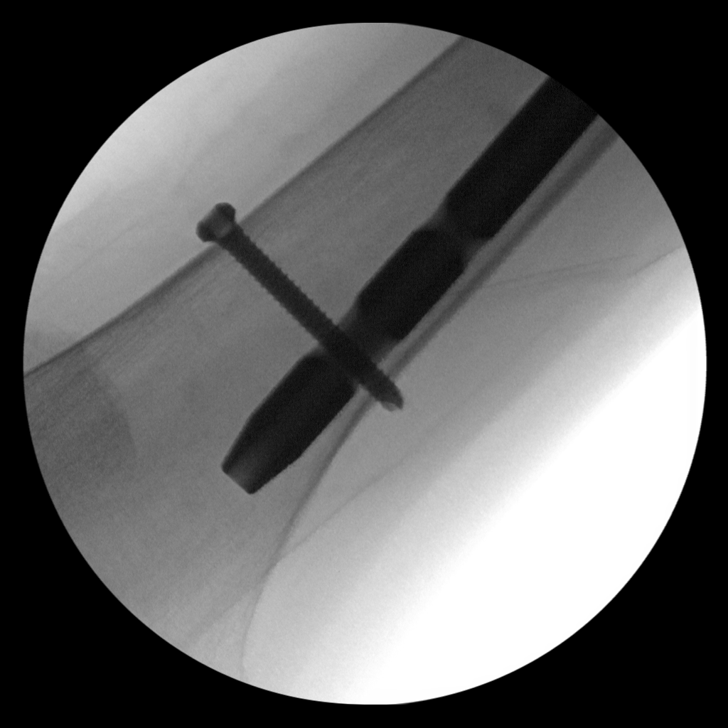
[im 5/9]
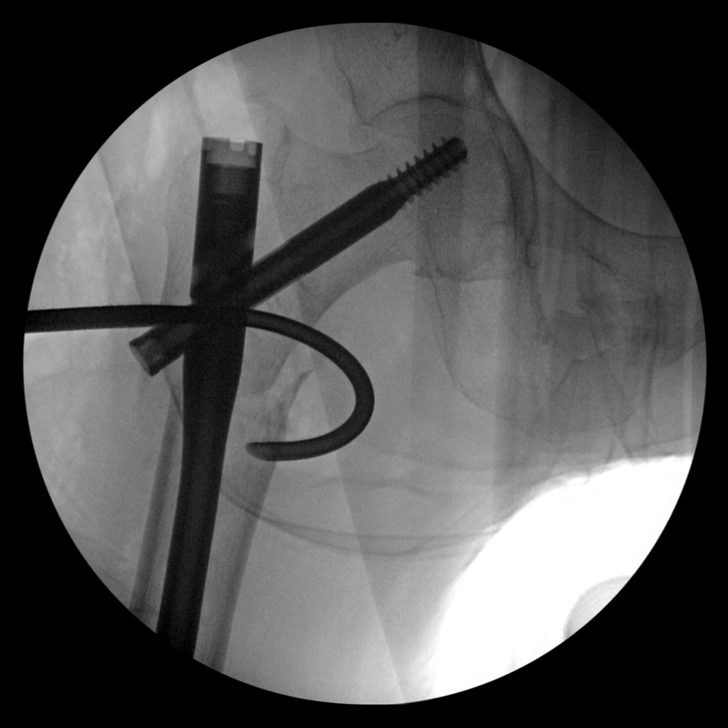
[im 6/9]
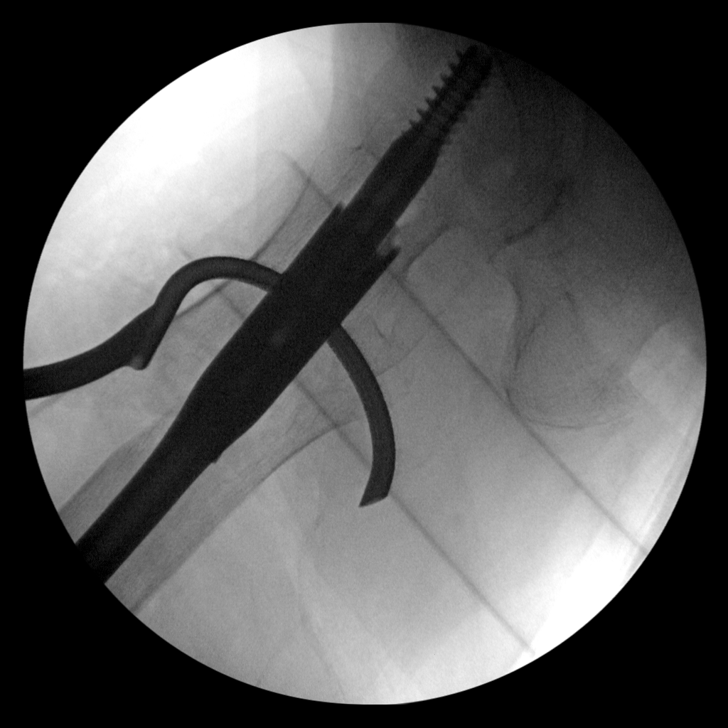
[im 7/9]
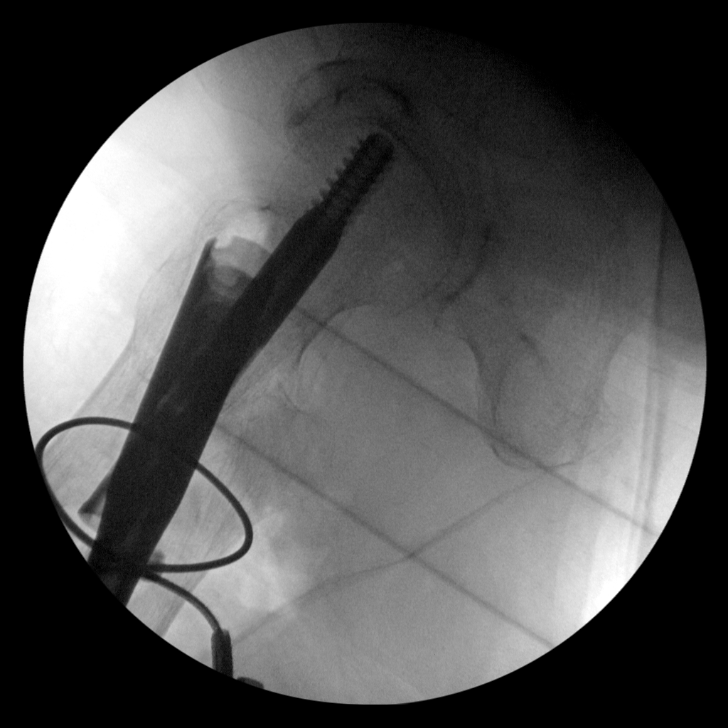
[im 8/9]
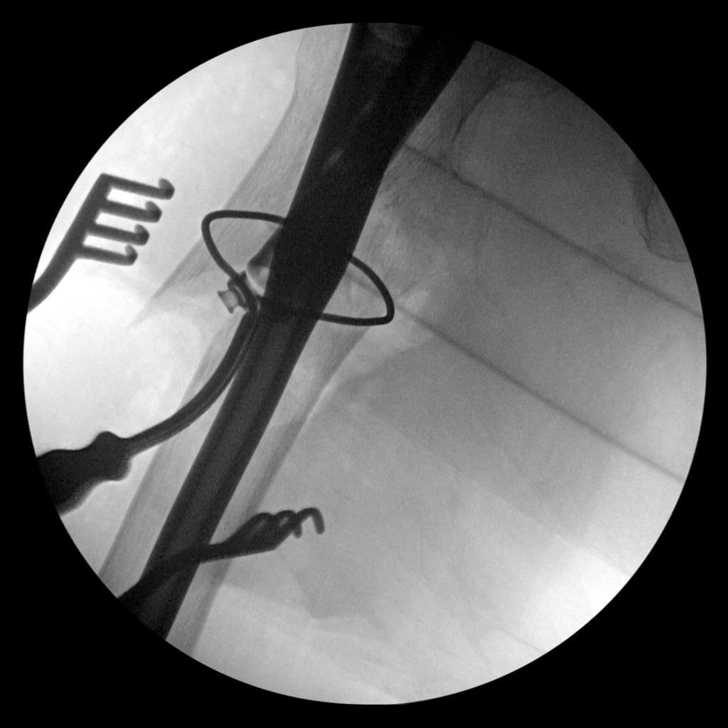
[im 9/9]
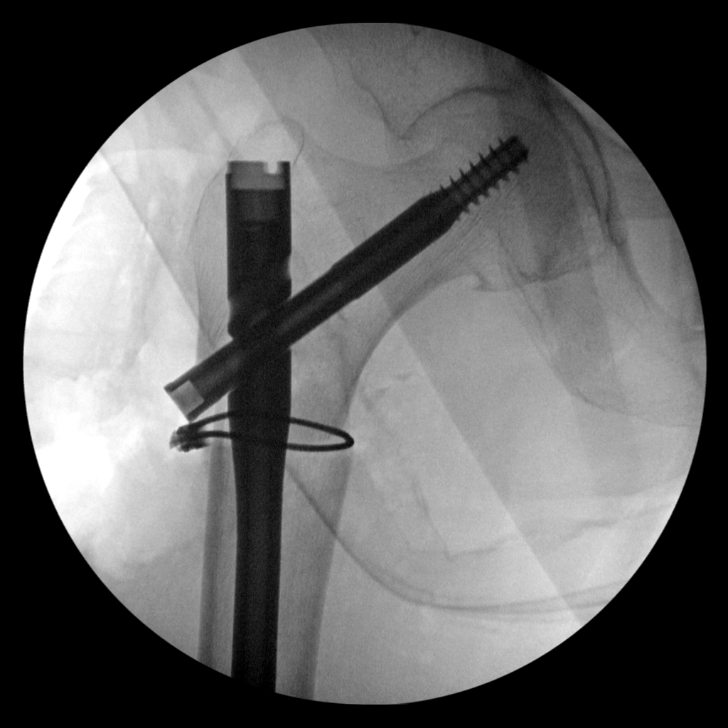

[9 of 9 positions shown; findings below may reference images not displayed]

FINDINGS: The patient has undergone intramedullary nail placement of the right
femur. The alignment appears significantly improved. There is no
unexpected radiopaque foreign body.
IMPRESSION: Status post ORIF of the right hip.

## 2023-07-01 NOTE — Progress Notes (Signed)
 Chief Complaint  Patient presents with  . Annual Exam    Physical     HPI  Cindy Meyer is a 89 y.o. here for an Annual Physical  Has been feeling well. Denies  Chest pains or shortness of breath. Non Smoker. No alcohol Lives alone.- Daughter and son live close by  No recent falls No tobacco-No alcohol Daughter  lives with her after 5 PM Labs : Hgb; 12.2, Sugar: 87  Se Creat: 1.1 AST: 47 A1c; 5.9  Total Cholesterol : 168,Triglycerides; 83 and TSH: 1.125   ROS Rest of 10 point review of systems is normal.  Outpatient Encounter Medications as of 07/01/2023  Medication Sig Dispense Refill  . alendronate (FOSAMAX) 70 MG tablet Take 1 tablet (70 mg total) by mouth every 7 (seven) days for 30 days Take with a full glass of water. Do not lie down for the next 30 min. (Patient not taking: Reported on 07/01/2023) 4 tablet 0  . bisacodyL  (DULCOLAX) 10 mg suppository Place 1 suppository rectally as needed    . hydroCHLOROthiazide  (HYDRODIURIL ) 12.5 MG tablet TAKE 1 TABLET BY MOUTH ONCE  DAILY 100 tablet 2  . losartan  (COZAAR ) 25 MG tablet TAKE 1 TABLET BY MOUTH ONCE  DAILY 100 tablet 2  . mirtazapine  (REMERON ) 7.5 MG tablet TAKE 1 TABLET BY MOUTH AT  BEDTIME 90 tablet 3  . multivitamin capsule Take 1 capsule by mouth once daily     No facility-administered encounter medications on file as of 07/01/2023.    Allergies as of 07/01/2023  . (No Known Allergies)    Past Medical History:  Diagnosis Date  . Hypertension     Past Surgical History:  Procedure Laterality Date  . IM NAILING FEMORAL SHAFT FRACTURE Right 07/11/2018   Dr. Kathlynn  . HERNIA REPAIR      Blood pressure 120/80, pulse 110, height 170.2 cm (5' 7), weight 59.7 kg (131 lb 9.6 oz), SpO2 98%.      Exam Blood pressure 120/80, pulse 110, height 170.2 cm (5' 7), weight 59.7 kg (131 lb 9.6 oz), SpO2 98%.   Wt Readings from Last 3 Encounters:  07/01/23 59.7 kg (131 lb 9.6 oz)  03/10/23 61.2 kg (135 lb)   06/16/22 61.9 kg (136 lb 6.4 oz)    Thin built  General: Alert oriented x3  Eyes: Sclera and conjunctiva clear; pupils equal round and reactive to light ; extraocular movements intact Ears:Normal  Nose: Mucosa healthy without drainage or ulceration Oropharynx: No suspicious lesions Neck: No swelling, masses, stiffness, pain, limited movement, carotid pulses normal bilaterally, thyroid normal size, no masses palpated. No bruits heard. Lungs: Respirations unlabored; clear to auscultation bilaterally Back: No spinal deformity Cardiovascular: Heart regular rate and rhythm without murmurs, gallops, or rubs Abdomen: Soft; non tender; non distended;  no masses or organomegaly Lymph Nodes: No significant cervical or supraclavicular lymphadenopathy noted Musculoskeletal: no active joint inflammation Extremities: Normal,  Neurologic: Alert and oriented; speech intact; face symmetrical; moves all extremities well     Assessment and Plan:   1 HTN:  On Losartan  25 mg po qd and HCT12.5 1 tab po daily  2 Anemia; Hgb is 12.5-Monitor  3 Abnormal LFT; AST ;47- Monitor  Up to date with Flu shot ,Pneumovax and  Tdap   Declines COVID vaccine  Mammogram- Ok -2014- Declines repeat  Declined Colonoscopy. Check cbc, met-c , TSH and A1c today  Follow up as scheduled     Tamra Leventhal  MD

## 2023-09-28 ENCOUNTER — Emergency Department

## 2023-09-28 ENCOUNTER — Observation Stay
Admission: EM | Admit: 2023-09-28 | Discharge: 2023-09-30 | Disposition: A | Discharge status: Home / Self Care | Attending: Family Medicine | Admitting: Family Medicine

## 2023-09-28 ENCOUNTER — Other Ambulatory Visit: Payer: Self-pay

## 2023-09-28 DIAGNOSIS — E876 Hypokalemia: Secondary | ICD-10-CM | POA: Insufficient documentation

## 2023-09-28 DIAGNOSIS — E860 Dehydration: Secondary | ICD-10-CM | POA: Insufficient documentation

## 2023-09-28 DIAGNOSIS — M6281 Muscle weakness (generalized): Secondary | ICD-10-CM | POA: Diagnosis not present

## 2023-09-28 DIAGNOSIS — N3001 Acute cystitis with hematuria: Secondary | ICD-10-CM | POA: Diagnosis not present

## 2023-09-28 DIAGNOSIS — I10 Essential (primary) hypertension: Secondary | ICD-10-CM | POA: Diagnosis not present

## 2023-09-28 DIAGNOSIS — R7881 Bacteremia: Secondary | ICD-10-CM | POA: Diagnosis not present

## 2023-09-28 DIAGNOSIS — R296 Repeated falls: Secondary | ICD-10-CM | POA: Insufficient documentation

## 2023-09-28 DIAGNOSIS — W010XXA Fall on same level from slipping, tripping and stumbling without subsequent striking against object, initial encounter: Secondary | ICD-10-CM | POA: Insufficient documentation

## 2023-09-28 DIAGNOSIS — N179 Acute kidney failure, unspecified: Secondary | ICD-10-CM | POA: Diagnosis not present

## 2023-09-28 DIAGNOSIS — W1830XA Fall on same level, unspecified, initial encounter: Secondary | ICD-10-CM | POA: Insufficient documentation

## 2023-09-28 DIAGNOSIS — R531 Weakness: Secondary | ICD-10-CM | POA: Diagnosis present

## 2023-09-28 DIAGNOSIS — N390 Urinary tract infection, site not specified: Secondary | ICD-10-CM | POA: Diagnosis not present

## 2023-09-28 DIAGNOSIS — R350 Frequency of micturition: Secondary | ICD-10-CM | POA: Diagnosis present

## 2023-09-28 DIAGNOSIS — Z79899 Other long term (current) drug therapy: Secondary | ICD-10-CM | POA: Diagnosis not present

## 2023-09-28 DIAGNOSIS — R000 Tachycardia, unspecified: Secondary | ICD-10-CM | POA: Insufficient documentation

## 2023-09-28 LAB — BLOOD CULTURE ID PANEL (REFLEXED) - BCID2

## 2023-09-28 LAB — BASIC METABOLIC PANEL WITH GFR
Anion gap: 11 (ref 5–15)
BUN: 25 mg/dL — ABNORMAL HIGH (ref 8–23)
CO2: 26 mmol/L (ref 22–32)
Calcium: 9 mg/dL (ref 8.9–10.3)
Chloride: 104 mmol/L (ref 98–111)
Creatinine, Ser: 1.45 mg/dL — ABNORMAL HIGH (ref 0.44–1.00)
GFR, Estimated: 33 mL/min — ABNORMAL LOW (ref >60)
Glucose, Bld: 144 mg/dL — ABNORMAL HIGH (ref 70–99)
Potassium: 3.1 mmol/L — ABNORMAL LOW (ref 3.5–5.1)
Sodium: 141 mmol/L (ref 135–145)

## 2023-09-28 LAB — LACTIC ACID, PLASMA: Lactic Acid, Venous: 1.2 mmol/L (ref 0.5–1.9)

## 2023-09-28 LAB — HEMOGLOBIN A1C
Hgb A1c MFr Bld: 5.5 % (ref 4.8–5.6)
Mean Plasma Glucose: 111.15 mg/dL

## 2023-09-28 LAB — TSH: TSH: 0.449 u[IU]/mL (ref 0.350–4.500)

## 2023-09-28 LAB — CBC
HCT: 33.6 % — ABNORMAL LOW (ref 36.0–46.0)
Hemoglobin: 10.7 g/dL — ABNORMAL LOW (ref 12.0–15.0)
MCH: 27.5 pg (ref 26.0–34.0)
MCHC: 31.8 g/dL (ref 30.0–36.0)
MCV: 86.4 fL (ref 80.0–100.0)
Platelets: 228 K/uL (ref 150–400)
RBC: 3.89 MIL/uL (ref 3.87–5.11)
RDW: 14 % (ref 11.5–15.5)
WBC: 11.8 K/uL — ABNORMAL HIGH (ref 4.0–10.5)
nRBC: 0 % (ref 0.0–0.2)

## 2023-09-28 LAB — URINALYSIS, W/ REFLEX TO CULTURE (INFECTION SUSPECTED)
Bilirubin Urine: NEGATIVE
Glucose, UA: NEGATIVE mg/dL
Ketones, ur: NEGATIVE mg/dL
Nitrite: NEGATIVE
Protein, ur: 100 mg/dL — AB
RBC / HPF: >50 RBC/hpf (ref 0–5)
Specific Gravity, Urine: 1.009 (ref 1.005–1.030)
Squamous Epithelial / HPF: 0 /HPF (ref 0–5)
WBC, UA: >50 WBC/hpf (ref 0–5)
pH: 6 (ref 5.0–8.0)

## 2023-09-28 LAB — VITAMIN D 25 HYDROXY (VIT D DEFICIENCY, FRACTURES): Vit D, 25-Hydroxy: 57.67 ng/mL (ref 30–100)

## 2023-09-28 LAB — CK: Total CK: 46 U/L (ref 38–234)

## 2023-09-28 LAB — VITAMIN B12: Vitamin B-12: 1246 pg/mL — ABNORMAL HIGH (ref 180–914)

## 2023-09-28 LAB — TROPONIN I (HIGH SENSITIVITY)
Troponin I (High Sensitivity): 12 ng/L (ref <18)
Troponin I (High Sensitivity): 15 ng/L (ref <18)

## 2023-09-28 MED ORDER — MIRTAZAPINE 15 MG PO TABS
7.5000 mg | ORAL_TABLET | Freq: Every day | ORAL | Status: DC
  Administered 2023-09-28 – 2023-09-29 (×2): 7.5 mg via ORAL
  Filled 2023-09-28 (×2): qty 1

## 2023-09-28 MED ORDER — SODIUM CHLORIDE 0.9 % IV SOLN: 250.0000 mL | INTRAVENOUS | Status: AC | PRN

## 2023-09-28 MED ORDER — ONDANSETRON HCL 4 MG PO TABS: 4.0000 mg | ORAL_TABLET | Freq: Four times a day (QID) | ORAL | Status: DC | PRN

## 2023-09-28 MED ORDER — SODIUM CHLORIDE 0.9% FLUSH
3.0000 mL | Freq: Two times a day (BID) | INTRAVENOUS | Status: DC
  Administered 2023-09-28 – 2023-09-30 (×3): 3 mL via INTRAVENOUS

## 2023-09-28 MED ORDER — BISACODYL 10 MG RE SUPP: 10.0000 mg | Freq: Every day | RECTAL | Status: DC | PRN

## 2023-09-28 MED ORDER — ACETAMINOPHEN 325 MG PO TABS
650.0000 mg | ORAL_TABLET | Freq: Four times a day (QID) | ORAL | Status: DC | PRN
  Administered 2023-09-30: 650 mg via ORAL
  Filled 2023-09-28: qty 2

## 2023-09-28 MED ORDER — HYDRALAZINE HCL 20 MG/ML IJ SOLN
5.0000 mg | Freq: Four times a day (QID) | INTRAMUSCULAR | Status: DC | PRN
Start: 2023-09-28 — End: 2023-09-30

## 2023-09-28 MED ORDER — ONDANSETRON HCL 4 MG/2ML IJ SOLN: 4.0000 mg | Freq: Four times a day (QID) | INTRAMUSCULAR | Status: DC | PRN

## 2023-09-28 MED ORDER — ADULT MULTIVITAMIN W/MINERALS CH
1.0000 | ORAL_TABLET | Freq: Every day | ORAL | Status: DC
  Administered 2023-09-28 – 2023-09-30 (×3): 1 via ORAL
  Filled 2023-09-28 (×4): qty 1

## 2023-09-28 MED ORDER — HEPARIN SODIUM (PORCINE) 5000 UNIT/ML IJ SOLN
5000.0000 [IU] | Freq: Three times a day (TID) | INTRAMUSCULAR | Status: DC
  Administered 2023-09-28 – 2023-09-30 (×7): 5000 [IU] via SUBCUTANEOUS
  Filled 2023-09-28 (×7): qty 1

## 2023-09-28 MED ORDER — DOCUSATE SODIUM 100 MG PO CAPS
100.0000 mg | ORAL_CAPSULE | Freq: Two times a day (BID) | ORAL | Status: DC
  Administered 2023-09-28 – 2023-09-29 (×2): 100 mg via ORAL
  Filled 2023-09-28 (×6): qty 1

## 2023-09-28 MED ORDER — SODIUM CHLORIDE 0.9 % IV BOLUS
1000.0000 mL | Freq: Once | INTRAVENOUS | Status: AC
  Administered 2023-09-28: 1000 mL via INTRAVENOUS

## 2023-09-28 MED ORDER — SODIUM CHLORIDE 0.9 % IV SOLN
1.0000 g | Freq: Once | INTRAVENOUS | Status: AC
  Administered 2023-09-28: 1 g via INTRAVENOUS
  Filled 2023-09-28: qty 10

## 2023-09-28 MED ORDER — LOSARTAN POTASSIUM 25 MG PO TABS
25.0000 mg | ORAL_TABLET | Freq: Every day | ORAL | Status: DC
  Administered 2023-09-28: 25 mg via ORAL
  Filled 2023-09-28 (×2): qty 1

## 2023-09-28 MED ORDER — POTASSIUM CHLORIDE CRYS ER 20 MEQ PO TBCR
40.0000 meq | EXTENDED_RELEASE_TABLET | Freq: Once | ORAL | Status: AC
  Administered 2023-09-28: 40 meq via ORAL
  Filled 2023-09-28 (×2): qty 2

## 2023-09-28 MED ORDER — SODIUM CHLORIDE 0.9 % IV SOLN: 1.0000 g | INTRAVENOUS | Status: DC

## 2023-09-28 MED ORDER — SODIUM CHLORIDE 0.9 % IV SOLN
2.0000 g | INTRAVENOUS | Status: DC
  Administered 2023-09-29 – 2023-09-30 (×2): 2 g via INTRAVENOUS
  Filled 2023-09-28 (×2): qty 20

## 2023-09-28 MED ORDER — SODIUM CHLORIDE 0.9% FLUSH: 3.0000 mL | INTRAVENOUS | Status: DC | PRN

## 2023-09-28 MED ORDER — SODIUM CHLORIDE 0.9 % IV SOLN: INTRAVENOUS | Status: DC

## 2023-09-28 MED ORDER — MAGNESIUM HYDROXIDE 400 MG/5ML PO SUSP: 30.0000 mL | Freq: Every day | ORAL | Status: DC | PRN

## 2023-09-28 MED ORDER — ACETAMINOPHEN 650 MG RE SUPP: 650.0000 mg | Freq: Four times a day (QID) | RECTAL | Status: DC | PRN

## 2023-09-28 NOTE — ED Provider Notes (Signed)
 [facility] Provider Note    Event Date/Time   First MD Initiated Contact with Patient 09/28/23 [phone]     (approximate)   History   Weakness and Urinary Frequency   HPI  Cindy Meyer is a 89 y.o. female   Past medical history of hypertension presents emergency department with urinary frequency and generalized weakness and fatigue.  She is here with her daughter and granddaughter.  She does have short-term memory difficulties.  She states that she is feeling generalized weakness fatigue and has urinary frequency over the past several days.  Daughter and granddaughter state that she usually is able to walk on her own, able to walk up the stairs, however she has gotten weak to the point where she needs help sitting up in bed getting out of bed to walk.  Subjective fever and chills.  Denies respiratory infectious symptoms, chest pain, shortness of breath, abdominal pain.  Another granddaughter who was with the patient yesterday said she felt weak and stumbled and fell getting out of the bathroom yesterday.  There is no significant trauma she was able to get up on her own and continue to walk and even able to walk up the stairs yesterday after the fall.  She is gotten progressively weaker since then.  There was no reported loss of consciousness with the fall and the patient herself does not recall the fall, but not uncommon per daughter report given her short-term memory problems.  Independent Historian contributed to assessment above: Daughter and granddaughter at bedside corroborate information is given above  External Medical Documents Reviewed: Outpatient notes annual exam from earlier this year      Physical Exam   Triage Vital Signs: ED Triage Vitals  Encounter Vitals Group     BP 09/28/23 0543 (!) 149/90     Girls Systolic BP Percentile --      Girls Diastolic BP Percentile --      Boys Systolic BP Percentile --      Boys Diastolic BP  Percentile --      Pulse Rate 09/28/23 0543 (!) 117     Resp 09/28/23 0543 18     Temp 09/28/23 0543 99.8 F (37.7 C)     Temp Source 09/28/23 0543 Oral     SpO2 09/28/23 0543 98 %     Weight 09/28/23 0542 146 lb 2.6 oz (66.3 kg)     Height 09/28/23 0542 5' 7 (1.702 m)     Head Circumference --      Peak Flow --      Pain Score 09/28/23 0542 0     Pain Loc --      Pain Education --      Exclude from Growth Chart --     Most recent vital signs: Vitals:   09/28/23 0543  BP: (!) 149/90  Pulse: (!) 117  Resp: 18  Temp: 99.8 F (37.7 C)  SpO2: 98%    General: Awake, no distress.  CV:  Good peripheral perfusion.  Resp:  Normal effort.  Abd:  No distention.  Other:  Hypertensive and tachycardic, afebrile, no respiratory distress, clear lungs to auscultation.  Tacky mucous membranes poor skin turgor appears slightly dehydrated.  She has a soft benign nonfocal nonperitoneal abdominal exam to palpation all quadrants.  She is able to range all extremities with full active range of motion, ranging at the hips with full active range of motion as well.  No tenderness palpation of the  chest wall, abdomen, and atraumatic bilateral upper and lower extremity exam.  No obvious signs of head trauma and neck is supple with full range of motion.   ED Results / Procedures / Treatments   Labs (all labs ordered are listed, but only abnormal results are displayed) Labs Reviewed  CBC - Abnormal; Notable for the following components:      Result Value   WBC 11.8 (*)    Hemoglobin 10.7 (*)    HCT 33.6 (*)    All other components within normal limits  CULTURE, BLOOD (ROUTINE X 2)  CULTURE, BLOOD (ROUTINE X 2)  BASIC METABOLIC PANEL WITH GFR  URINALYSIS, W/ REFLEX TO CULTURE (INFECTION SUSPECTED)  LACTIC ACID, PLASMA  LACTIC ACID, PLASMA  CK  TROPONIN I (HIGH SENSITIVITY)     I ordered and reviewed the above labs they are notable for leukocytosis white blood cell count slightly elevated  11.8.  EKG  ED ECG REPORT I, Ginnie Shams, the attending physician, personally viewed and interpreted this ECG.   Date: 09/28/2023  EKG Time: 0635  Rate: 101  Rhythm: sinus tachycardia  Axis: nl  Intervals: Right bundle branch block  ST&T Change: No STEMI    PROCEDURES:  Critical Care performed: Yes, see critical care procedure note(s)  .Critical Care  Performed by: Shams Ginnie, MD Authorized by: Shams Ginnie, MD   Critical care provider statement:    Critical care time (minutes):  30   Critical care was time spent personally by me on the following activities:  Development of treatment plan with patient or surrogate, discussions with consultants, evaluation of patient's response to treatment, examination of patient, ordering and review of laboratory studies, ordering and review of radiographic studies, ordering and performing treatments and interventions, pulse oximetry, re-evaluation of patient's condition and review of old charts    MEDICATIONS ORDERED IN ED: Medications  cefTRIAXone  (ROCEPHIN ) 1 g in sodium chloride  0.9 % 100 mL IVPB (1 g Intravenous New Bag/Given 09/28/23 0644)  sodium chloride  0.9 % bolus 1,000 mL (1,000 mLs Intravenous New Bag/Given 09/28/23 0608)   IMPRESSION / MDM / ASSESSMENT AND PLAN / ED COURSE  I reviewed the triage vital signs and the nursing notes.                                Patient's presentation is most consistent with acute presentation with potential threat to life or bodily function.  Differential diagnosis includes, but is not limited to, urinary tract infection, sepsis, dehydration electrolyte derangements, considered but less likely intra-abdominal infection, respiratory infection   The patient is on the cardiac monitor to evaluate for evidence of arrhythmia and/or significant heart rate changes.  MDM:    I am concerned that her urinary frequency is a urinary tract infection that has led to her generalized weakness and fatigue  and perhaps sepsis given her tachycardia.  She appears dehydrated and indeed she has had poor p.o. intake over the last several days.  I will give her a fluid bolus.  I will check labs including sepsis labs like blood culture lactic.  Will collect a urinalysis.  Anticipate admission.  She did have a fall but sustained no significant trauma from the fall and was ambulatory afterwards.  Unknown head strike but given her age and subsequent deterioration/global weakness I think it prudent to check a CT head and neck to rule out ICH.SABRA  There is no other trauma sustained elsewhere  by patient report no or on my trauma examination today.  Patient unable to give initial urine and still put on external catheter for urine collection.  Given high clinical suspicion for urinary tract infection given her urinary symptoms, will start with IV Rocephin  for antibiotic as with her tachycardia and concern for sepsis want to start abx early.   EKG does show some nonspecific intraventricular conduction delay, perhaps a right bundle branch block bifascicular block and nonspecific ST changes but no obvious overt ischemic changes, though it is abnormal and changed from previous EKG, her previous EKG was obtained almost 15 years ago and no recent prior for comparison.  She notes no chest pain whatsoever, doubt cardiac ischemia, will check troponins.      FINAL CLINICAL IMPRESSION(S) / ED DIAGNOSES   Final diagnoses:  Urinary frequency  Generalized weakness  Tachycardia  Dehydration     Rx / DC Orders   ED Discharge Orders     None        Note:  This document was prepared using Dragon voice recognition software and may include unintentional dictation errors.    Cyrena Mylar, MD 09/28/23 [phone]

## 2023-09-28 NOTE — ED Provider Notes (Addendum)
 7:38 AM Assumed care for off going team.   Blood pressure (!) 162/99, pulse (!) 110, temperature 99.8 F (37.7 C), temperature source Oral, resp. rate (!) 21, height 5' 7 (1.702 m), weight 66.3 kg, SpO2 97%.  See their HPI for full report but in brief pending UA  1. No acute intracranial abnormality.  2. Atrophy with chronic small vessel ischemic disease.  3. No evidence for cervical spine fracture or subluxation.  4. 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  5. Multinodular thyroid with 1.7 cm left thyroid nodule. Recommend  thyroid US  (ref: J Am Coll Radiol. 2015 Feb;12(2): 143-50).   Incidental finding d/w family.   UA concerning for UTI.  Does have some RBCs noted in it so I considered the possibility of kidney stones.  She denies any history of kidney stones and I reviewed a CT scan from 2021 of the abdomen pelvis where there was not any kidney stones noted. Family also deny any history of kidney stones.  I reevaluated patient and she denies any abdominal pain.  I sat patient up to evaluate back but she denies any CVA tenderness.  Discussed with family they deny any abdominal pain or back pain to suggest a kidney stone we discussed that if she starts having pain in these areas to let us  know if there is a kidney stone causing it that can change what is done but they report that all she has had recently is just the increased urination which she is more consistent with a UTI.  I will discuss the hospitalist for admission given her weakness, tachycardia secondary to UTI.    9:29 AM there was a delay in hospitalist admission so I went in to re-evaluate pt and she continued to deny any abdominal pain/flank pain, HR has come down to 98. Family at bedside and they will let us  know if any changes to symptoms.     Ernest Ronal BRAVO, MD 09/28/23 0740    Ernest Ronal BRAVO, MD 09/28/23 [phone]

## 2023-09-28 NOTE — ED Triage Notes (Signed)
 Pt reports inc urinary frequency and generalized weakness over the past few days. Pt alert and oriented x4. Pt denies pain at this time.

## 2023-09-28 NOTE — H&P (Addendum)
 [facility] History and Physical  Cindy Meyer FMW:[id number] DOB: 10/08/1926 DOA: 09/28/2023  Referring physician: ED  PCP: Sadie Manna, MD   Patient is coming from: Home  Chief Complaint: Falls and weakness  HPI:  Patient is a 89 year old years old female with past medical history of hypertension presented to hospital with generalized weakness, fatigue and increased urinary frequency.  Patient denies any dysuria, hematuria but some urgency.  Patient does have some short-term memory difficulty.  Normally she is independent and walks on her own but recently has been having more weakness and had recently stumbled off her bed yesterday.  Patient's daughter at bedside stated that she did have some fever at home.  Patient lives by herself.  Patient denied any shortness of breath, cough, chest pain or dyspnea.  Denies any nausea, vomiting or abdominal pain.  States that her appetite is okay.  Has had bowel movements. In the ED, patient had a temperature of 99.8 F.  She was slightly tachycardic and mildly tachypneic.  Labs were notable for mild leukocytosis with WBC at 11.8.  Lactic acid at 1.2.  CK level 46.  BMP was notable for potassium of 3.1 and creatinine of 1.4.  Patient was then considered for admission to hospital for further evaluation and treatment.   Assessment and Plan Principal Problem:   UTI (urinary tract infection) Active Problems:   HTN (hypertension)   AKI (acute kidney injury) (HCC)   Hypokalemia   Falls   Generalized weakness  UTI.  Had some urinary frequency and urgency.  Will continue with IV Rocephin .  Urine culture pending at this time.  Follow-up blood cultures sent from ED.  Generalized weakness falls.  Will get PT OT evaluation.  Patient otherwise independent as per the patient's daughter at bedside.  Will also check TSH hemoglobin A1c vitamin B12, vitamin D  levels.  Replenish potassium.  Hypokalemia.  Potassium of 3.1.  Will replenish.  Check  levels in AM.  Hold HCTZ from home.  Mild AKI.  Creatinine of 1.4 at this time.  Baseline creatinine around 0.6-0.8.  Will hold off with losartan  and diuretics for now.  Will continue to monitor.  Will continue gentle hydration for today.  Essential hypertension.  On HCTZ.  Will hold for now.  Put on as needed hydralazine .  DVT Prophylaxis: Heparin  subcu  Review of Systems:  All systems were reviewed and were negative unless otherwise mentioned in the HPI   Past Medical History:  Diagnosis Date   Hypertension    Past Surgical History:  Procedure Laterality Date   HERNIA REPAIR     INTRAMEDULLARY (IM) NAIL INTERTROCHANTERIC Right 07/11/2018   Procedure: INTRAMEDULLARY (IM) NAIL INTERTROCHANTRIC RIGHT LONG AFFIXUS;  Surgeon: Kathlynn Sharper, MD;  Location: [facility] ORS;  Service: Orthopedics;  Laterality: Right;    Social History:  reports that she has never smoked. She has never used smokeless tobacco. She reports that she does not drink alcohol and does not use drugs.  No Known Allergies  History reviewed. No pertinent family history.   Prior to Admission medications   Medication Sig Start Date End Date Taking? Authorizing Provider  bisacodyl  (DULCOLAX) 10 MG suppository Place 1 suppository (10 mg total) rectally daily as needed for moderate constipation. 07/14/18  Yes Laurence Bridegroom, MD  docusate sodium  (COLACE) 100 MG capsule Take 1 capsule (100 mg total) by mouth 2 (two) times daily. 07/14/18  Yes Laurence Bridegroom, MD  hydrochlorothiazide  (HYDRODIURIL ) 12.5 MG tablet Take 12.5 mg by mouth daily. 05/02/18  Yes [provider]  losartan  (COZAAR ) 25 MG tablet Take 25 mg by mouth daily.   Yes [provider]  magnesium  hydroxide (MILK OF MAGNESIA) 400 MG/5ML suspension Take 30 mLs by mouth daily as needed for mild constipation. 07/14/18  Yes Laurence Bridegroom, MD  mirtazapine  (REMERON ) 7.5 MG tablet Take 7.5 mg by mouth at bedtime.   Yes [provider]  Multiple Vitamins-Minerals  (MULTIVITAMIN ADULT, MINERALS, PO) Take 1 capsule by mouth daily.   Yes [provider]  enoxaparin  (LOVENOX ) 30 MG/0.3ML injection Inject 0.3 mLs (30 mg total) into the skin daily for 14 days. 07/13/18 07/27/18  Charlene Debby BROCKS, PA-C  ferrous fumarate-b12-vitamic C-folic acid (TRINSICON / FOLTRIN) capsule Take 1 capsule by mouth 2 (two) times daily after a meal. Patient not taking: Reported on 09/28/2023 07/14/18   Laurence Bridegroom, MD    Physical Exam:  Vitals:   09/28/23 0730 09/28/23 0800 09/28/23 0900 09/28/23 1027  BP: (!) 166/92 (!) 159/75 (!) 147/98   Pulse: 99 (!) 115 98   Resp: 19 (!) 24 18   Temp:    99.8 F (37.7 C)  TempSrc:    Oral  SpO2: 94% 95% 97%   Weight:      Height:       Wt Readings from Last 3 Encounters:  09/28/23 66.3 kg  07/11/18 49.9 kg  08/19/17 49.9 kg   Body mass index is 22.89 kg/m.  General:  Average built, not in obvious distress, elderly female, Communicative HENT: Normocephalic, No scleral pallor or icterus noted. Oral mucosa is moist.  Chest:  Clear breath sounds.  . No crackles or wheezes.  CVS: S1 &S2 heard. No murmur.  Regular rate and rhythm. Abdomen: Soft, nontender, nondistended.  Bowel sounds are heard. No abdominal mass palpated Extremities: No cyanosis, clubbing or edema.  Peripheral pulses are palpable. Psych: Alert, awake and oriented, normal mood CNS:  No cranial nerve deficits.  Moves all extremities.  Generalized weakness noted. Skin: Warm and dry.  No rashes noted.  Labs on Admission:   CBC: Recent Labs  Lab 09/28/23 0610  WBC 11.8*  HGB 10.7*  HCT 33.6*  MCV 86.4  PLT 228    Basic Metabolic Panel: Recent Labs  Lab 09/28/23 0610  NA 141  K 3.1*  CL 104  CO2 26  GLUCOSE 144*  BUN 25*  CREATININE 1.45*  CALCIUM 9.0    Liver Function Tests: No results for input(s): AST, ALT, ALKPHOS, BILITOT, PROT, ALBUMIN in the last 168 hours. No results for input(s): LIPASE, AMYLASE in the last 168  hours. No results for input(s): AMMONIA in the last 168 hours.  Cardiac Enzymes: Recent Labs  Lab 09/28/23 0610  CKTOTAL 46    BNP (last 3 results) No results for input(s): BNP in the last 8760 hours.  ProBNP (last 3 results) No results for input(s): PROBNP in the last 8760 hours.  CBG: No results for input(s): GLUCAP in the last 168 hours.  Lipase  No results found for: LIPASE   Urinalysis    Component Value Date/Time   COLORURINE YELLOW (A) 09/28/2023 0642   APPEARANCEUR CLOUDY (A) 09/28/2023 0642   LABSPEC 1.009 09/28/2023 0642   PHURINE 6.0 09/28/2023 0642   GLUCOSEU NEGATIVE 09/28/2023 0642   HGBUR MODERATE (A) 09/28/2023 0642   BILIRUBINUR NEGATIVE 09/28/2023 0642   KETONESUR NEGATIVE 09/28/2023 0642   PROTEINUR 100 (A) 09/28/2023 0642   NITRITE NEGATIVE 09/28/2023 0642   LEUKOCYTESUR LARGE (A) 09/28/2023 9357  Drugs of Abuse  No results found for: LABOPIA, COCAINSCRNUR, LABBENZ, AMPHETMU, THCU, LABBARB    Radiological Exams on Admission: CT Head Wo Contrast Result Date: 09/28/2023 CLINICAL DATA:  Generalized weakness and fatigue.  Status post fall. EXAM: CT HEAD WITHOUT CONTRAST CT CERVICAL SPINE WITHOUT CONTRAST TECHNIQUE: Multidetector CT imaging of the head and cervical spine was performed following the standard protocol without intravenous contrast. Multiplanar CT image reconstructions of the cervical spine were also generated. RADIATION DOSE REDUCTION: This exam was performed according to the departmental dose-optimization program which includes automated exposure control, adjustment of the mA and/or kV according to patient size and/or use of iterative reconstruction technique. COMPARISON:  No comparison studies available. FINDINGS: CT HEAD FINDINGS Brain: There is no evidence for acute hemorrhage, hydrocephalus, mass lesion, or abnormal extra-axial fluid collection. No definite CT evidence for acute infarction. Diffuse loss of  parenchymal volume is consistent with atrophy. Patchy low attenuation in the deep hemispheric and periventricular white matter is nonspecific, but likely reflects chronic microvascular ischemic demyelination. Mineralization of the basal ganglia noted bilaterally. Vascular: No hyperdense vessel or unexpected calcification. Skull: No evidence for fracture. No worrisome lytic or sclerotic lesion. Sinuses/Orbits: Trace frothy mucosal disease is identified in the left sphenoid sinus. Remaining visualized sinuses are clear. Visualized portions of the globes and intraorbital fat are unremarkable. Other: None. CT CERVICAL SPINE FINDINGS Alignment: Normal. Skull base and vertebrae: No acute fracture. No primary bone lesion or focal pathologic process. Soft tissues and spinal canal: Multinodular thyroid evident with 1.7 cm left thyroid nodule on image 70. Otherwise unremarkable. Disc levels: Loss of disc height with endplate degeneration noted at C2-3, C3-4, and C4-5. Facets are well aligned bilaterally. Upper chest: Biapical pleuroparenchymal scarring evident. Other: None. IMPRESSION: 1. No acute intracranial abnormality. 2. Atrophy with chronic small vessel ischemic disease. 3. No evidence for cervical spine fracture or subluxation. 4. 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 5. Multinodular thyroid with 1.7 cm left thyroid nodule. Recommend thyroid US  (ref: J Am Coll Radiol. 2015 Feb;12(2): 143-50). Electronically Signed   By: Camellia Candle M.D.   On: 09/28/2023 07:18   CT Cervical Spine Wo Contrast Result Date: 09/28/2023 CLINICAL DATA:  Generalized weakness and fatigue.  Status post fall. EXAM: CT HEAD WITHOUT CONTRAST CT CERVICAL SPINE WITHOUT CONTRAST TECHNIQUE: Multidetector CT imaging of the head and cervical spine was performed following the standard protocol without intravenous contrast. Multiplanar CT image reconstructions of the cervical spine were also generated. RADIATION DOSE REDUCTION: This  exam was performed according to the departmental dose-optimization program which includes automated exposure control, adjustment of the mA and/or kV according to patient size and/or use of iterative reconstruction technique. COMPARISON:  No comparison studies available. FINDINGS: CT HEAD FINDINGS Brain: There is no evidence for acute hemorrhage, hydrocephalus, mass lesion, or abnormal extra-axial fluid collection. No definite CT evidence for acute infarction. Diffuse loss of parenchymal volume is consistent with atrophy. Patchy low attenuation in the deep hemispheric and periventricular white matter is nonspecific, but likely reflects chronic microvascular ischemic demyelination. Mineralization of the basal ganglia noted bilaterally. Vascular: No hyperdense vessel or unexpected calcification. Skull: No evidence for fracture. No worrisome lytic or sclerotic lesion. Sinuses/Orbits: Trace frothy mucosal disease is identified in the left sphenoid sinus. Remaining visualized sinuses are clear. Visualized portions of the globes and intraorbital fat are unremarkable. Other: None. CT CERVICAL SPINE FINDINGS Alignment: Normal. Skull base and vertebrae: No acute fracture. No primary bone lesion or focal pathologic process. Soft tissues and  spinal canal: Multinodular thyroid evident with 1.7 cm left thyroid nodule on image 70. Otherwise unremarkable. Disc levels: Loss of disc height with endplate degeneration noted at C2-3, C3-4, and C4-5. Facets are well aligned bilaterally. Upper chest: Biapical pleuroparenchymal scarring evident. Other: None. IMPRESSION: 1. No acute intracranial abnormality. 2. Atrophy with chronic small vessel ischemic disease. 3. No evidence for cervical spine fracture or subluxation. 4. 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 5. Multinodular thyroid with 1.7 cm left thyroid nodule. Recommend thyroid US  (ref: J Am Coll Radiol. 2015 Feb;12(2): 143-50). Electronically Signed   By: Camellia Candle M.D.   On: 09/28/2023 07:18   DG Chest Port 1 View Result Date: 09/28/2023 CLINICAL DATA:  Sepsis.  Weakness. EXAM: PORTABLE CHEST 1 VIEW COMPARISON:  07/10/2018 FINDINGS: Lungs are hyperinflated. The lungs are clear without focal pneumonia, edema, pneumothorax or pleural effusion. Interstitial markings are diffusely coarsened with chronic features. Biapical pleuroparenchymal scarring evident. Bones are diffusely demineralized. Telemetry leads overlie the chest. IMPRESSION: Hyperinflation with chronic interstitial coarsening. No acute cardiopulmonary findings. Electronically Signed   By: Camellia Candle M.D.   On: 09/28/2023 07:08    EKG: Personally reviewed by me which shows sinus tachycardia   Consultant: None  Code Status: DNR  Microbiology urine culture blood culture  Antibiotics: Rocephin  IV  Family Communication:  Patients' condition and plan of care including tests being ordered have been discussed with the patient and the patient's daughter at bedside who indicate understanding and agree with the plan.   Status is: Observation   Severity of Illness: The appropriate patient status for this patient is OBSERVATION. Observation status is judged to be reasonable and necessary in order to provide the required intensity of service to ensure the patient's safety. The patient's presenting symptoms, physical exam findings, and initial radiographic and laboratory data in the context of their medical condition is felt to place them at decreased risk for further clinical deterioration. Furthermore, it is anticipated that the patient will be medically stable for discharge from the hospital within 2 midnights of admission.   Signed, Vernal Alstrom, MD [facility] 09/28/2023

## 2023-09-28 NOTE — Evaluation (Signed)
 Occupational Therapy Evaluation Patient Details Name: Cindy Meyer MRN: [id number] DOB: [birth year] Today's Date: 09/28/2023   History of Present Illness   89 year old years old female with past medical history of hypertension presented to hospital with generalized weakness fatigue and increased urinary frequency.  Patient denies any dysuria hematuria but some urgency.  Patient does have some short-term memory difficulty.  Normally she is independent and walks on her own but recently has been having more weakness and had recently stumbled off her bed yesterday.  Patient's daughter at bedside stated that she did have some fever at home.  Patient lives by herself.  Patient denied any shortness of breath, cough, chest pain or dyspnea.  Denies any nausea, vomiting or abdominal pain.  States that her appetite is okay.     Clinical Impressions Patient presenting with decreased Ind in self care,balance, functional mobility/transfers, endurance, and safety awareness. Patient reports being Ind at baseline without use of AD and living at home alone. Daughter spends the night for safety. Pt with short term memory deficits at baseline.Pt oriented to self and location.  Patient currently functioning at [facility]- min A for mobility and self care tasks. Pt ambulating to bathroom while pushing IV pole and using toilet for self care needs before returning back to bed at end of session. Daughter present in room at time of evaluation and reports cognition at this time is baseline. Patient will benefit from acute OT to increase overall independence in the areas of ADLs, functional mobility, and safety awareness in order to safely discharge.     If plan is discharge home, recommend the following:   A little help with walking and/or transfers;A little help with bathing/dressing/bathroom;Assistance with cooking/housework;Supervision due to cognitive status;Assist for transportation;Help with stairs or ramp for entrance      Functional Status Assessment   Patient has had a recent decline in their functional status and demonstrates the ability to make significant improvements in function in a reasonable and predictable amount of time.     Equipment Recommendations   None recommended by OT      Precautions/Restrictions   Precautions Precautions: Fall     Mobility Bed Mobility Overal bed mobility: Needs Assistance Bed Mobility: Supine to Sit, Sit to Supine     Supine to sit: Contact guard Sit to supine: Contact guard assist        Transfers Overall transfer level: Needs assistance Equipment used: 1 person hand held assist Transfers: Sit to/from Stand Sit to Stand: Contact guard assist                  Balance Overall balance assessment: Needs assistance Sitting-balance support: Feet supported Sitting balance-Leahy Scale: Good     Standing balance support: During functional activity, Bilateral upper extremity supported Standing balance-Leahy Scale: Fair                             ADL either performed or assessed with clinical judgement   ADL Overall ADL's : Needs assistance/impaired                         Toilet Transfer: Minimal Holiday representative;Ambulation Toilet Transfer Details (indicate cue type and reason): pushing IV pole Toileting- Clothing Manipulation and Hygiene: Minimal assistance;Sit to/from stand               Vision Baseline Vision/History: 1 Wears glasses Patient Visual Report: No change from baseline  Pertinent Vitals/Pain Pain Assessment Pain Assessment: No/denies pain     Extremity/Trunk Assessment Upper Extremity Assessment Upper Extremity Assessment: Generalized weakness   Lower Extremity Assessment Lower Extremity Assessment: Generalized weakness       Communication Communication Communication: No apparent difficulties   Cognition Arousal: Alert Behavior During Therapy: WFL for  tasks assessed/performed Cognition: History of cognitive impairments             OT - Cognition Comments: short term memory deficits at baseline(daugther in room reporting her mother appears at cognitive baseline during session). Oriented to self and location.                 Following commands: Impaired Following commands impaired: Follows one step commands with increased time     Cueing  General Comments   Cueing Techniques: Verbal cues;Tactile cues              Home Living Family/patient expects to be discharged to:: Private residence Living Arrangements: Alone Available Help at Discharge: Family;Available PRN/intermittently Type of Home: Apartment Home Access: Other (comment);Stairs to enter (8 steps to bathroom and bedrooms) Entrance Stairs-Number of Steps: 1   Home Layout: Two level;Bed/bath upstairs Alternate Level Stairs-Number of Steps: sleeps on couch on main level at times and has 8 steps to get to second floor.   Bathroom Shower/Tub: Sponge bathes at baseline         Home Equipment: Agricultural consultant (2 wheels);BSC/3in1          Prior Functioning/Environment Prior Level of Function : Needs assist               ADLs Comments: Pt does not use AD for mobility. She is able to perform ADLs herself. Daughter sleeps over at night for safety and assists with IADLs as needed. Pt does not drive.    OT Problem List: Decreased strength;Decreased safety awareness;Decreased activity tolerance;Decreased knowledge of precautions;Impaired balance (sitting and/or standing)   OT Treatment/Interventions: Self-care/ADL training;Therapeutic activities;Energy conservation;Patient/family education;Balance training      OT Goals(Current goals can be found in the care plan section)   Acute Rehab OT Goals Patient Stated Goal: to go home OT Goal Formulation: With patient/family Time For Goal Achievement: 10/12/23 Potential to Achieve Goals: Fair ADL Goals Pt  Will Perform Grooming: with modified independence;standing Pt Will Perform Lower Body Dressing: with modified independence;sit to/from stand Pt Will Transfer to Toilet: with modified independence;ambulating Pt Will Perform Toileting - Clothing Manipulation and hygiene: with modified independence;sit to/from stand   OT Frequency:  Min 2X/week       AM-PAC OT 6 Clicks Daily Activity     Outcome Measure Help from another person eating meals?: None Help from another person taking care of personal grooming?: A Little Help from another person toileting, which includes using toliet, bedpan, or urinal?: A Little Help from another person bathing (including washing, rinsing, drying)?: A Little Help from another person to put on and taking off regular upper body clothing?: A Little Help from another person to put on and taking off regular lower body clothing?: A Little 6 Click Score: 19   End of Session Equipment Utilized During Treatment: Rolling walker (2 wheels) Nurse Communication: Mobility status  Activity Tolerance: Patient tolerated treatment well Patient left: in bed;with call bell/phone within reach;with bed alarm set;with family/visitor present  OT Visit Diagnosis: Unsteadiness on feet (R26.81);Muscle weakness (generalized) (M62.81)                Time: 8555-8496 OT Time Calculation (  min): 19 min Charges:  OT General Charges $OT Visit: 1 Visit OT Evaluation $OT Eval Low Complexity: 1 Low OT Treatments $Self Care/Home Management : 8-22 mins  Izetta Claude, MS, OTR/L , CBIS ascom [phone]  09/28/23, 4:40 PM

## 2023-09-28 NOTE — Consult Note (Signed)
 PHARMACY - PHYSICIAN COMMUNICATION CRITICAL VALUE ALERT - BLOOD CULTURE IDENTIFICATION (BCID)  Cindy Meyer is an 89 y.o. female who presented to [facility] on 09/28/2023 with a chief complaint of  UTI  Assessment:  1 of 3 bottles, aerobic bottle, Gram stain GNRs, BCID E coli without resistance genes. Source: UTI  Name of physician (or Provider) Contacted: Mansy  Current antibiotics: Ceftriaxone   Changes to prescribed antibiotics recommended:  Recommendations accepted by provider. Patient is on ceftriaxone  however dose will be adjusted to 2g which is bacteremia dosing.  Results for orders placed or performed during the hospital encounter of 09/28/23  Blood Culture ID Panel (Reflexed) (Collected: 09/28/2023  6:10 AM)  Result Value Ref Range   Enterococcus faecalis NOT DETECTED NOT DETECTED   Enterococcus Faecium NOT DETECTED NOT DETECTED   Listeria monocytogenes NOT DETECTED NOT DETECTED   Staphylococcus species NOT DETECTED NOT DETECTED   Staphylococcus aureus (BCID) NOT DETECTED NOT DETECTED   Staphylococcus epidermidis NOT DETECTED NOT DETECTED   Staphylococcus lugdunensis NOT DETECTED NOT DETECTED   Streptococcus species NOT DETECTED NOT DETECTED   Streptococcus agalactiae NOT DETECTED NOT DETECTED   Streptococcus pneumoniae NOT DETECTED NOT DETECTED   Streptococcus pyogenes NOT DETECTED NOT DETECTED   A.calcoaceticus-baumannii NOT DETECTED NOT DETECTED   Bacteroides fragilis NOT DETECTED NOT DETECTED   Enterobacterales DETECTED (A) NOT DETECTED   Enterobacter cloacae complex NOT DETECTED NOT DETECTED   Escherichia coli DETECTED (A) NOT DETECTED   Klebsiella aerogenes NOT DETECTED NOT DETECTED   Klebsiella oxytoca NOT DETECTED NOT DETECTED   Klebsiella pneumoniae NOT DETECTED NOT DETECTED   Proteus species NOT DETECTED NOT DETECTED   Salmonella species NOT DETECTED NOT DETECTED   Serratia marcescens NOT DETECTED NOT DETECTED   Haemophilus influenzae NOT DETECTED NOT  DETECTED   Neisseria meningitidis NOT DETECTED NOT DETECTED   Pseudomonas aeruginosa NOT DETECTED NOT DETECTED   Stenotrophomonas maltophilia NOT DETECTED NOT DETECTED   Candida albicans NOT DETECTED NOT DETECTED   Candida auris NOT DETECTED NOT DETECTED   Candida glabrata NOT DETECTED NOT DETECTED   Candida krusei NOT DETECTED NOT DETECTED   Candida parapsilosis NOT DETECTED NOT DETECTED   Candida tropicalis NOT DETECTED NOT DETECTED   Cryptococcus neoformans/gattii NOT DETECTED NOT DETECTED   CTX-M ESBL NOT DETECTED NOT DETECTED   Carbapenem resistance IMP NOT DETECTED NOT DETECTED   Carbapenem resistance KPC NOT DETECTED NOT DETECTED   Carbapenem resistance NDM NOT DETECTED NOT DETECTED   Carbapenem resist OXA 48 LIKE NOT DETECTED NOT DETECTED   Carbapenem resistance VIM NOT DETECTED NOT DETECTED    Elsie CHRISTELLA Piety 09/28/2023  8:31 PM

## 2023-09-29 DIAGNOSIS — I159 Secondary hypertension, unspecified: Secondary | ICD-10-CM

## 2023-09-29 DIAGNOSIS — R7881 Bacteremia: Secondary | ICD-10-CM | POA: Diagnosis not present

## 2023-09-29 DIAGNOSIS — N179 Acute kidney failure, unspecified: Secondary | ICD-10-CM

## 2023-09-29 DIAGNOSIS — R531 Weakness: Secondary | ICD-10-CM

## 2023-09-29 DIAGNOSIS — R296 Repeated falls: Secondary | ICD-10-CM | POA: Diagnosis not present

## 2023-09-29 DIAGNOSIS — E876 Hypokalemia: Secondary | ICD-10-CM | POA: Diagnosis not present

## 2023-09-29 DIAGNOSIS — N390 Urinary tract infection, site not specified: Secondary | ICD-10-CM | POA: Diagnosis not present

## 2023-09-29 LAB — BASIC METABOLIC PANEL WITH GFR
Anion gap: 9 (ref 5–15)
BUN: 21 mg/dL (ref 8–23)
CO2: 25 mmol/L (ref 22–32)
Calcium: 8.8 mg/dL — ABNORMAL LOW (ref 8.9–10.3)
Chloride: 108 mmol/L (ref 98–111)
Creatinine, Ser: 1.02 mg/dL — ABNORMAL HIGH (ref 0.44–1.00)
GFR, Estimated: 51 mL/min — ABNORMAL LOW (ref >60)
Glucose, Bld: 120 mg/dL — ABNORMAL HIGH (ref 70–99)
Potassium: 3.5 mmol/L (ref 3.5–5.1)
Sodium: 142 mmol/L (ref 135–145)

## 2023-09-29 LAB — CBC
HCT: 32.9 % — ABNORMAL LOW (ref 36.0–46.0)
Hemoglobin: 10.4 g/dL — ABNORMAL LOW (ref 12.0–15.0)
MCH: 27.4 pg (ref 26.0–34.0)
MCHC: 31.6 g/dL (ref 30.0–36.0)
MCV: 86.8 fL (ref 80.0–100.0)
Platelets: 221 K/uL (ref 150–400)
RBC: 3.79 MIL/uL — ABNORMAL LOW (ref 3.87–5.11)
RDW: 14.3 % (ref 11.5–15.5)
WBC: 9.7 K/uL (ref 4.0–10.5)
nRBC: 0 % (ref 0.0–0.2)

## 2023-09-29 LAB — MAGNESIUM: Magnesium: 2.2 mg/dL (ref 1.7–2.4)

## 2023-09-29 NOTE — Progress Notes (Signed)
 Mobility Specialist - Progress Note   09/29/23 1128  Mobility  Activity Pivoted/transferred to/from [facility]  Level of Assistance Minimal assist, patient does 75% or more  Assistive Device None  Distance Ambulated (ft) 6 ft  Activity Response Tolerated well  Mobility visit 1 Mobility  Mobility Specialist Start Time (ACUTE ONLY) 0902  Mobility Specialist Stop Time (ACUTE ONLY) 0908  Mobility Specialist Time Calculation (min) (ACUTE ONLY) 6 min   America Silvan Mobility Specialist 09/29/23 11:31 AM

## 2023-09-29 NOTE — Care Management Obs Status (Signed)
 MEDICARE OBSERVATION STATUS NOTIFICATION   Patient Details  Name: Cindy Meyer MRN: [id number] Date of Birth: 11/04/1926   Medicare Observation Status Notification Given:  No (did not want a copy)    Cindy Meyer 09/29/2023, 11:03 AM

## 2023-09-29 NOTE — Progress Notes (Signed)
 PROGRESS NOTE    Cindy Meyer  FMW:[id number] DOB: 02-Jan-1927 DOA: 09/28/2023 PCP: Sadie Manna, MD  Chief Complaint  Patient presents with   Weakness   Urinary Frequency    Hospital Course:  Cindy Meyer is a 89 year old female with hypertension, presents with fatigue, generalized weakness, increased urinary frequency.  On arrival to the ED she was tachycardic, temperature 99.8, labs reveal leukocytosis of 11.8 and creatinine of 1.4.  UA consistent with UTI lower urinary tract symptoms.  Patient was admitted for treatment. Blood cultures now positive for E Coli.   Subjective: This morning patient reports she is feeling much better.  She worked well with physical therapy.  Her granddaughter is at bedside and reports that she seems stronger today but is not quite to baseline.  I also discussed her care with her daughter on the phone.  They report that the patient has been having short-term memory loss outpatient as well with some acute increase over the last few days.  They also endorse recent decrease in appetite.  They are interested in pursuing home health at discharge.   Objective: Vitals:   09/28/23 2059 09/29/23 0417 09/29/23 0505 09/29/23 0830  BP: (!) 144/84 (!) 146/95  128/70  Pulse: (!) 103 (!) 103  94  Resp: 16 17  16   Temp: 99.3 F (37.4 C) 98.3 F (36.8 C)  97.9 F (36.6 C)  TempSrc: Oral Oral    SpO2: 100% 100%  100%  Weight:   59.7 kg   Height:        Intake/Output Summary (Last 24 hours) at 09/29/2023 1512 Last data filed at 09/29/2023 0900 Gross per 24 hour  Intake 360 ml  Output --  Net 360 ml   Filed Weights   09/28/23 0542 09/29/23 0505  Weight: 66.3 kg 59.7 kg    Examination: General exam: Appears calm and comfortable, NAD  Respiratory system: No work of breathing, symmetric chest wall expansion Cardiovascular system: S1 & S2 heard, RRR.  Gastrointestinal system: Abdomen is nondistended, soft and nontender.  Neuro: Alert and oriented.  No focal neurological deficits. Extremities: Symmetric, expected ROM Skin: No rashes, lesions Psychiatry: Demonstrates appropriate judgement and insight. Mood & affect appropriate for situation.   Assessment & Plan:  Principal Problem:   UTI (urinary tract infection) Active Problems:   HTN (hypertension)   AKI (acute kidney injury) (HCC)   Hypokalemia   Falls   Generalized weakness   UTI Bacteremia - Urine and blood cultures now both growing E. coli, pending sensitivities - Continue with Rocephin . - Urinary frequency and urgency improving some  Generalized weakness and fall - PT/OT evaluations, home health ordered. - TSH, hemoglobin A1c, B12, vitamin D  WNL  Hypokalemia - Replace as needed  AKI - Baseline creatinine 0.67, creatinine 1.4 on arrival - Downtrending now - Continue to monitor CMP - At home on losartan  and HCTZ some evidence of dehydration on arrival likely secondary to diuretics - Can resume home losartan  gradually  Hypertension - Hold HCTZ as above - As needed hydralazine  - BP Goal SBP<150   DVT prophylaxis: heparin    Code Status: Limited: Do not attempt resuscitation (DNR) -DNR-LIMITED -Do Not Intubate/DNI  Disposition:  Pending E Coli sensitivities, likely HH tomorrow  Consultants:    Procedures:    Antimicrobials:  Anti-infectives (From admission, onward)    Start     Dose/Rate Route Frequency Ordered Stop   09/29/23 0800  cefTRIAXone  (ROCEPHIN ) 2 g in sodium chloride  0.9 % 100 mL IVPB  2 g 200 mL/hr over 30 Minutes Intravenous Every 24 hours 09/28/23 2027     09/29/23 0600  cefTRIAXone  (ROCEPHIN ) 1 g in sodium chloride  0.9 % 100 mL IVPB  Status:  Discontinued        1 g 200 mL/hr over 30 Minutes Intravenous Every 24 hours 09/28/23 1110 09/28/23 2027   09/28/23 2115  cefTRIAXone  (ROCEPHIN ) 1 g in sodium chloride  0.9 % 100 mL IVPB       Note to Pharmacy: Please give dose to complete 2 g total of ceftriaxone  on 8/19   1 g 200 mL/hr  over 30 Minutes Intravenous  Once 09/28/23 2027 09/28/23 2159   09/28/23 0615  cefTRIAXone  (ROCEPHIN ) 1 g in sodium chloride  0.9 % 100 mL IVPB        1 g 200 mL/hr over 30 Minutes Intravenous  Once 09/28/23 9386 09/28/23 0745       Data Reviewed: I have personally reviewed following labs and imaging studies CBC: Recent Labs  Lab 09/28/23 0610 09/29/23 0437  WBC 11.8* 9.7  HGB 10.7* 10.4*  HCT 33.6* 32.9*  MCV 86.4 86.8  PLT 228 221   Basic Metabolic Panel: Recent Labs  Lab 09/28/23 0610 09/29/23 0437  NA 141 142  K 3.1* 3.5  CL 104 108  CO2 26 25  GLUCOSE 144* 120*  BUN 25* 21  CREATININE 1.45* 1.02*  CALCIUM 9.0 8.8*  MG  --  2.2   GFR: Estimated Creatinine Clearance: 31.8 mL/min (A) (by C-G formula based on SCr of 1.02 mg/dL (H)). Liver Function Tests: No results for input(s): AST, ALT, ALKPHOS, BILITOT, PROT, ALBUMIN in the last 168 hours. CBG: No results for input(s): GLUCAP in the last 168 hours.  Recent Results (from the past 240 hours)  Culture, blood (routine x 2)     Status: None (Preliminary result)   Collection Time: 09/28/23  6:10 AM   Specimen: BLOOD  Result Value Ref Range Status   Specimen Description BLOOD RIGHT FA  Final   Special Requests   Final    BOTTLES DRAWN AEROBIC AND ANAEROBIC Blood Culture adequate volume   Culture  Setup Time   Final    GRAM NEGATIVE RODS AEROBIC BOTTLE ONLY CRITICAL VALUE NOTED.  VALUE IS CONSISTENT WITH PREVIOUSLY REPORTED AND CALLED VALUE. Performed at [facility], [address] [place], [place] [address]    Culture GRAM NEGATIVE RODS  Final   Report Status PENDING  Incomplete  Culture, blood (routine x 2)     Status: Abnormal (Preliminary result)   Collection Time: 09/28/23  6:10 AM   Specimen: BLOOD  Result Value Ref Range Status   Specimen Description   Final    BLOOD RIGHT HAND Performed at [facility], [address], [place], [place] [address]    Special  Requests   Final    BOTTLES DRAWN AEROBIC ONLY Blood Culture results may not be optimal due to an inadequate volume of blood received in culture bottles Performed at [facility], [address] [place], [place] [address]    Culture  Setup Time   Final    GRAM NEGATIVE RODS AEROBIC BOTTLE ONLY CRITICAL RESULT CALLED TO, READ BACK BY AND VERIFIED WITH: WILL ANDERSON 2018 09/28/23 MU    Culture (A)  Final    ESCHERICHIA COLI CULTURE REINCUBATED FOR BETTER GROWTH Performed at [facility], [address] [place], [place] [address]    Report Status PENDING  Incomplete  Blood  Culture ID Panel (Reflexed)     Status: Abnormal   Collection Time: 09/28/23  6:10 AM  Result Value Ref Range Status   Enterococcus faecalis NOT DETECTED NOT DETECTED Final   Enterococcus Faecium NOT DETECTED NOT DETECTED Final   Listeria monocytogenes NOT DETECTED NOT DETECTED Final   Staphylococcus species NOT DETECTED NOT DETECTED Final   Staphylococcus aureus (BCID) NOT DETECTED NOT DETECTED Final   Staphylococcus epidermidis NOT DETECTED NOT DETECTED Final   Staphylococcus lugdunensis NOT DETECTED NOT DETECTED Final   Streptococcus species NOT DETECTED NOT DETECTED Final   Streptococcus agalactiae NOT DETECTED NOT DETECTED Final   Streptococcus pneumoniae NOT DETECTED NOT DETECTED Final   Streptococcus pyogenes NOT DETECTED NOT DETECTED Final   A.calcoaceticus-baumannii NOT DETECTED NOT DETECTED Final   Bacteroides fragilis NOT DETECTED NOT DETECTED Final   Enterobacterales DETECTED (A) NOT DETECTED Final    Comment: Enterobacterales represent a large order of gram negative bacteria, not a single organism. CRITICAL RESULT CALLED TO, READ BACK BY AND VERIFIED WITH: WILL ANDERSON 2018 09/28/23 MU    Enterobacter cloacae complex NOT DETECTED NOT DETECTED Final   Escherichia coli DETECTED (A) NOT DETECTED Final    Comment: CRITICAL RESULT CALLED TO, READ BACK BY AND VERIFIED WITH: WILL ANDERSON  2018 09/28/23 MU    Klebsiella aerogenes NOT DETECTED NOT DETECTED Final   Klebsiella oxytoca NOT DETECTED NOT DETECTED Final   Klebsiella pneumoniae NOT DETECTED NOT DETECTED Final   Proteus species NOT DETECTED NOT DETECTED Final   Salmonella species NOT DETECTED NOT DETECTED Final   Serratia marcescens NOT DETECTED NOT DETECTED Final   Haemophilus influenzae NOT DETECTED NOT DETECTED Final   Neisseria meningitidis NOT DETECTED NOT DETECTED Final   Pseudomonas aeruginosa NOT DETECTED NOT DETECTED Final   Stenotrophomonas maltophilia NOT DETECTED NOT DETECTED Final   Candida albicans NOT DETECTED NOT DETECTED Final   Candida auris NOT DETECTED NOT DETECTED Final   Candida glabrata NOT DETECTED NOT DETECTED Final   Candida krusei NOT DETECTED NOT DETECTED Final   Candida parapsilosis NOT DETECTED NOT DETECTED Final   Candida tropicalis NOT DETECTED NOT DETECTED Final   Cryptococcus neoformans/gattii NOT DETECTED NOT DETECTED Final   CTX-M ESBL NOT DETECTED NOT DETECTED Final   Carbapenem resistance IMP NOT DETECTED NOT DETECTED Final   Carbapenem resistance KPC NOT DETECTED NOT DETECTED Final   Carbapenem resistance NDM NOT DETECTED NOT DETECTED Final   Carbapenem resist OXA 48 LIKE NOT DETECTED NOT DETECTED Final   Carbapenem resistance VIM NOT DETECTED NOT DETECTED Final    Comment: Performed at [facility], [address], [place], [place] [address]  Urine Culture     Status: Abnormal (Preliminary result)   Collection Time: 09/28/23  6:42 AM   Specimen: Urine, Random  Result Value Ref Range Status   Specimen Description   Final    URINE, RANDOM Performed at [facility], [address], [place], [place] [address]    Special Requests   Final    NONE Reflexed from [id number] Performed at [facility], [address] [place], [place] [address]    Culture (A)  Final    >=100,000 COLONIES/mL ESCHERICHIA COLI SUSCEPTIBILITIES TO FOLLOW Performed at  [facility], [address] [place], [place] [address]    Report Status PENDING  Incomplete     Radiology Studies: CT Head Wo Contrast Result Date: 09/28/2023 CLINICAL DATA:  Generalized weakness and fatigue.  Status post fall. EXAM: CT HEAD WITHOUT  CONTRAST CT CERVICAL SPINE WITHOUT CONTRAST TECHNIQUE: Multidetector CT imaging of the head and cervical spine was performed following the standard protocol without intravenous contrast. Multiplanar CT image reconstructions of the cervical spine were also generated. RADIATION DOSE REDUCTION: This exam was performed according to the departmental dose-optimization program which includes automated exposure control, adjustment of the mA and/or kV according to patient size and/or use of iterative reconstruction technique. COMPARISON:  No comparison studies available. FINDINGS: CT HEAD FINDINGS Brain: There is no evidence for acute hemorrhage, hydrocephalus, mass lesion, or abnormal extra-axial fluid collection. No definite CT evidence for acute infarction. Diffuse loss of parenchymal volume is consistent with atrophy. Patchy low attenuation in the deep hemispheric and periventricular white matter is nonspecific, but likely reflects chronic microvascular ischemic demyelination. Mineralization of the basal ganglia noted bilaterally. Vascular: No hyperdense vessel or unexpected calcification. Skull: No evidence for fracture. No worrisome lytic or sclerotic lesion. Sinuses/Orbits: Trace frothy mucosal disease is identified in the left sphenoid sinus. Remaining visualized sinuses are clear. Visualized portions of the globes and intraorbital fat are unremarkable. Other: None. CT CERVICAL SPINE FINDINGS Alignment: Normal. Skull base and vertebrae: No acute fracture. No primary bone lesion or focal pathologic process. Soft tissues and spinal canal: Multinodular thyroid evident with 1.7 cm left thyroid nodule on image 70. Otherwise unremarkable. Disc levels: Loss of  disc height with endplate degeneration noted at C2-3, C3-4, and C4-5. Facets are well aligned bilaterally. Upper chest: Biapical pleuroparenchymal scarring evident. Other: None. IMPRESSION: 1. No acute intracranial abnormality. 2. Atrophy with chronic small vessel ischemic disease. 3. No evidence for cervical spine fracture or subluxation. 4. 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 5. Multinodular thyroid with 1.7 cm left thyroid nodule. Recommend thyroid US  (ref: J Am Coll Radiol. 2015 Feb;12(2): 143-50). Electronically Signed   By: Camellia Candle M.D.   On: 09/28/2023 07:18   CT Cervical Spine Wo Contrast Result Date: 09/28/2023 CLINICAL DATA:  Generalized weakness and fatigue.  Status post fall. EXAM: CT HEAD WITHOUT CONTRAST CT CERVICAL SPINE WITHOUT CONTRAST TECHNIQUE: Multidetector CT imaging of the head and cervical spine was performed following the standard protocol without intravenous contrast. Multiplanar CT image reconstructions of the cervical spine were also generated. RADIATION DOSE REDUCTION: This exam was performed according to the departmental dose-optimization program which includes automated exposure control, adjustment of the mA and/or kV according to patient size and/or use of iterative reconstruction technique. COMPARISON:  No comparison studies available. FINDINGS: CT HEAD FINDINGS Brain: There is no evidence for acute hemorrhage, hydrocephalus, mass lesion, or abnormal extra-axial fluid collection. No definite CT evidence for acute infarction. Diffuse loss of parenchymal volume is consistent with atrophy. Patchy low attenuation in the deep hemispheric and periventricular white matter is nonspecific, but likely reflects chronic microvascular ischemic demyelination. Mineralization of the basal ganglia noted bilaterally. Vascular: No hyperdense vessel or unexpected calcification. Skull: No evidence for fracture. No worrisome lytic or sclerotic lesion. Sinuses/Orbits: Trace  frothy mucosal disease is identified in the left sphenoid sinus. Remaining visualized sinuses are clear. Visualized portions of the globes and intraorbital fat are unremarkable. Other: None. CT CERVICAL SPINE FINDINGS Alignment: Normal. Skull base and vertebrae: No acute fracture. No primary bone lesion or focal pathologic process. Soft tissues and spinal canal: Multinodular thyroid evident with 1.7 cm left thyroid nodule on image 70. Otherwise unremarkable. Disc levels: Loss of disc height with endplate degeneration noted at C2-3, C3-4, and C4-5. Facets are well aligned bilaterally. Upper chest: Biapical pleuroparenchymal scarring evident. Other:  None. IMPRESSION: 1. No acute intracranial abnormality. 2. Atrophy with chronic small vessel ischemic disease. 3. No evidence for cervical spine fracture or subluxation. 4. 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 5. Multinodular thyroid with 1.7 cm left thyroid nodule. Recommend thyroid US  (ref: J Am Coll Radiol. 2015 Feb;12(2): 143-50). Electronically Signed   By: Camellia Candle M.D.   On: 09/28/2023 07:18   DG Chest Port 1 View Result Date: 09/28/2023 CLINICAL DATA:  Sepsis.  Weakness. EXAM: PORTABLE CHEST 1 VIEW COMPARISON:  07/10/2018 FINDINGS: Lungs are hyperinflated. The lungs are clear without focal pneumonia, edema, pneumothorax or pleural effusion. Interstitial markings are diffusely coarsened with chronic features. Biapical pleuroparenchymal scarring evident. Bones are diffusely demineralized. Telemetry leads overlie the chest. IMPRESSION: Hyperinflation with chronic interstitial coarsening. No acute cardiopulmonary findings. Electronically Signed   By: Camellia Candle M.D.   On: 09/28/2023 07:08    Scheduled Meds:  docusate sodium   100 mg Oral BID   heparin   5,000 Units Subcutaneous Q8H   mirtazapine   7.5 mg Oral QHS   multivitamin with minerals  1 tablet Oral Daily   sodium chloride  flush  3 mL Intravenous Q12H   Continuous  Infusions:  cefTRIAXone  (ROCEPHIN )  IV 2 g (09/29/23 0918)     LOS: 0 days  MDM: Patient is high risk for one or more organ failure.  They necessitate ongoing hospitalization for continued IV therapies and subsequent lab monitoring. Total time spent interpreting labs and vitals, reviewing the medical record, coordinating care amongst consultants and care team members, directly assessing and discussing care with the patient and/or family: 55 min Cindy Triplett, DO [facility]  To contact the attending physician between 7A-7P please use Epic Chat. To contact the covering physician during after hours 7P-7A, please review Amion.  09/29/2023, 3:12 PM   *This document has been created with the assistance of dictation software. Please excuse typographical errors. *

## 2023-09-29 NOTE — Evaluation (Signed)
 Physical Therapy Evaluation Patient Details Name: Cindy Meyer MRN: [id number] DOB: 1926/09/05 Today's Date: 09/29/2023  History of Present Illness  Pt is as 89 yo female that presented to ED for weakness, family reported fall at home, increased urinary frequency, workup for UTI, mild AKI. PMH of HTN, IM nailing of R hip 2020.   Clinical Impression  Pt alert, oriented to person and place, agreeable to PT evaluation. Pt's grand-daughter present for session and able to provide details of pt's PLOF/home layout. Pt cited pain in R arm at start of session, RN informed of swelling around IV site. At baseline, pt is IND with mobility with no prior AD use and able to complete IADLs without assistance. Pt able to complete bed mobility and don slippers at EOB with supervision. Pt able to complete STS transfers with RW and CGA. Pt able to ambulate ~127ft, 73ft with RW and 55ft with no AD. Pt demonstrated increased path variance and postural sway without AD, tendency to search for objects in path for UE support. Pt navigated 6 steps in stairwell with one hand rail and contralateral HHA, able to descend with step-to pattern and ascend with alternating pattern with CGA. HR 118 bpm after descending stairs, 105bpm at end of session. SpO2 maintained between 98-99% on RA. Pt is currently displaying deficits in functional mobility/dynamic balance and would benefit from skilled PT intervention to address these deficits and allow for safe return to PLOF.       If plan is discharge home, recommend the following: A little help with walking and/or transfers;Assistance with cooking/housework;Direct supervision/assist for medications management;Assist for transportation;Help with stairs or ramp for entrance;Supervision due to cognitive status   Can travel by private vehicle        Equipment Recommendations None recommended by PT  Recommendations for Other Services       Functional Status Assessment Patient has had  a recent decline in their functional status and demonstrates the ability to make significant improvements in function in a reasonable and predictable amount of time.     Precautions / Restrictions Precautions Precautions: Fall Restrictions Weight Bearing Restrictions Per Provider Order: No      Mobility  Bed Mobility Overal bed mobility: Needs Assistance Bed Mobility: Supine to Sit     Supine to sit: Supervision, HOB elevated     General bed mobility comments: able to come to sitting EOB with supervision    Transfers Overall transfer level: Needs assistance Equipment used: Rolling walker (2 wheels) Transfers: Sit to/from Stand Sit to Stand: Contact guard assist           General transfer comment: Sit <> stand x2 from EOB, one with no AD one with RW, CGA. Stand > sit into recliner with CGA    Ambulation/Gait Ambulation/Gait assistance: Contact guard assist Gait Distance (Feet): 100 Feet Assistive device: Rolling walker (2 wheels), None Gait Pattern/deviations: Step-through pattern, Trunk flexed, Wide base of support Gait velocity: decreased     General Gait Details: Amb 60 ft with RW and CGA, 40 ft with no AD- overall decreased cadence, increased postural sway/path deviation with no AD and tendency to search for objects in path for UE support.  Stairs Stairs: Yes Stairs assistance: Contact guard assist Stair Management: One rail Right, Alternating pattern, Step to pattern, Forwards Number of Stairs: 6 General stair comments: Pt descended stairs with L hand rail and R HHA with step-to pattern and CGA. Ascended with R hand rail and L HHA with alternating pattern and  CGA  Wheelchair Mobility     Tilt Bed    Modified Rankin (Stroke Patients Only)       Balance Overall balance assessment: Needs assistance Sitting-balance support: Feet supported Sitting balance-Leahy Scale: Good     Standing balance support: Bilateral upper extremity supported, No upper  extremity supported Standing balance-Leahy Scale: Fair Standing balance comment: pt appeared more steady with UE support                             Pertinent Vitals/Pain Pain Assessment Pain Assessment: No/denies pain    Home Living Family/patient expects to be discharged to:: Private residence Living Arrangements: Alone Available Help at Discharge: Family;Available PRN/intermittently Type of Home: Apartment Home Access: Stairs to enter Entrance Stairs-Rails: None Entrance Stairs-Number of Steps: 1 Alternate Level Stairs-Number of Steps: 8 Home Layout: Two level;Bed/bath upstairs Home Equipment: Rolling Walker (2 wheels);BSC/3in1 Additional Comments: Pt's grand-daughter states that she is living with pt currently, but family is not present at all times.    Prior Function Prior Level of Function : Independent/Modified Independent             Mobility Comments: Pt denies prior AD use, reports being IND with navigating stairs at home ADLs Comments: IND with IADLs     Extremity/Trunk Assessment   Upper Extremity Assessment Upper Extremity Assessment: Defer to OT evaluation    Lower Extremity Assessment Lower Extremity Assessment: Generalized weakness       Communication   Communication Communication: No apparent difficulties    Cognition Arousal: Alert Behavior During Therapy: WFL for tasks assessed/performed   PT - Cognitive impairments: History of cognitive impairments                       PT - Cognition Comments: Pt alert, oriented to self & place. Pt's grand-daughter assisted with providing pt's PLOF and confirming home living details Following commands: Impaired Following commands impaired: Follows one step commands with increased time     Cueing Cueing Techniques: Verbal cues, Visual cues     General Comments      Exercises Other Exercises Other Exercises: HR 118 bpm after descending stairs, HR 105bpm at end of session. SpO2  remained between 98-99% on RA   Assessment/Plan    PT Assessment Patient needs continued PT services  PT Problem List Decreased strength;Decreased activity tolerance;Decreased balance;Decreased mobility;Decreased knowledge of use of DME;Decreased safety awareness       PT Treatment Interventions DME instruction;Gait training;Stair training;Functional mobility training;Therapeutic activities;Therapeutic exercise;Balance training;Neuromuscular re-education;Cognitive remediation;Patient/family education    PT Goals (Current goals can be found in the Care Plan section)  Acute Rehab PT Goals Patient Stated Goal: to get better PT Goal Formulation: With patient Time For Goal Achievement: 10/13/23 Potential to Achieve Goals: Good    Frequency Min 2X/week     Co-evaluation               AM-PAC PT 6 Clicks Mobility  Outcome Measure Help needed turning from your back to your side while in a flat bed without using bedrails?: None Help needed moving from lying on your back to sitting on the side of a flat bed without using bedrails?: None Help needed moving to and from a bed to a chair (including a wheelchair)?: None Help needed standing up from a chair using your arms (e.g., wheelchair or bedside chair)?: None Help needed to walk in hospital room?: A Little Help needed  climbing 3-5 steps with a railing? : A Little 6 Click Score: 22    End of Session Equipment Utilized During Treatment: Gait belt Activity Tolerance: Patient tolerated treatment well Patient left: in chair;with call bell/phone within reach;with chair alarm set;with family/visitor present Nurse Communication: Mobility status PT Visit Diagnosis: Unsteadiness on feet (R26.81);Other abnormalities of gait and mobility (R26.89);Muscle weakness (generalized) (M62.81);Difficulty in walking, not elsewhere classified (R26.2)    Time: 9085-9065 PT Time Calculation (min) (ACUTE ONLY): 20 min   Charges:   PT Evaluation $PT  Eval Low Complexity: 1 Low PT Treatments $Therapeutic Activity: 8-22 mins PT General Charges $$ ACUTE PT VISIT: 1 Visit         Janell Axe, SPT

## 2023-09-30 ENCOUNTER — Other Ambulatory Visit: Payer: Self-pay

## 2023-09-30 DIAGNOSIS — R296 Repeated falls: Secondary | ICD-10-CM | POA: Diagnosis not present

## 2023-09-30 DIAGNOSIS — N390 Urinary tract infection, site not specified: Secondary | ICD-10-CM | POA: Diagnosis not present

## 2023-09-30 DIAGNOSIS — E876 Hypokalemia: Secondary | ICD-10-CM | POA: Diagnosis not present

## 2023-09-30 DIAGNOSIS — R531 Weakness: Secondary | ICD-10-CM | POA: Diagnosis not present

## 2023-09-30 DIAGNOSIS — N179 Acute kidney failure, unspecified: Secondary | ICD-10-CM | POA: Diagnosis not present

## 2023-09-30 LAB — URINE CULTURE: Culture: >=100000 — AB

## 2023-09-30 LAB — CBC WITH DIFFERENTIAL/PLATELET
Abs Immature Granulocytes: 0.03 K/uL (ref 0.00–0.07)
Basophils Absolute: 0 K/uL (ref 0.0–0.1)
Basophils Relative: 0 %
Eosinophils Absolute: 0.2 K/uL (ref 0.0–0.5)
Eosinophils Relative: 3 %
HCT: 32.3 % — ABNORMAL LOW (ref 36.0–46.0)
Hemoglobin: 10.1 g/dL — ABNORMAL LOW (ref 12.0–15.0)
Immature Granulocytes: 0 %
Lymphocytes Relative: 15 %
Lymphs Abs: 1.2 K/uL (ref 0.7–4.0)
MCH: 27.5 pg (ref 26.0–34.0)
MCHC: 31.3 g/dL (ref 30.0–36.0)
MCV: 88 fL (ref 80.0–100.0)
Monocytes Absolute: 0.9 K/uL (ref 0.1–1.0)
Monocytes Relative: 11 %
Neutro Abs: 5.6 K/uL (ref 1.7–7.7)
Neutrophils Relative %: 71 %
Platelets: 225 K/uL (ref 150–400)
RBC: 3.67 MIL/uL — ABNORMAL LOW (ref 3.87–5.11)
RDW: 14.1 % (ref 11.5–15.5)
WBC: 7.9 K/uL (ref 4.0–10.5)
nRBC: 0 % (ref 0.0–0.2)

## 2023-09-30 LAB — COMPREHENSIVE METABOLIC PANEL WITH GFR
ALT: 17 U/L (ref 0–44)
AST: 48 U/L — ABNORMAL HIGH (ref 15–41)
Albumin: 2.8 g/dL — ABNORMAL LOW (ref 3.5–5.0)
Alkaline Phosphatase: 49 U/L (ref 38–126)
Anion gap: 8 (ref 5–15)
BUN: 19 mg/dL (ref 8–23)
CO2: 25 mmol/L (ref 22–32)
Calcium: 8.7 mg/dL — ABNORMAL LOW (ref 8.9–10.3)
Chloride: 108 mmol/L (ref 98–111)
Creatinine, Ser: 1.05 mg/dL — ABNORMAL HIGH (ref 0.44–1.00)
GFR, Estimated: 49 mL/min — ABNORMAL LOW (ref >60)
Glucose, Bld: 110 mg/dL — ABNORMAL HIGH (ref 70–99)
Potassium: 3.6 mmol/L (ref 3.5–5.1)
Sodium: 141 mmol/L (ref 135–145)
Total Bilirubin: 0.4 mg/dL (ref 0.0–1.2)
Total Protein: 6.8 g/dL (ref 6.5–8.1)

## 2023-09-30 LAB — MAGNESIUM: Magnesium: 2.2 mg/dL (ref 1.7–2.4)

## 2023-09-30 LAB — PHOSPHORUS: Phosphorus: 2.5 mg/dL (ref 2.5–4.6)

## 2023-09-30 MED ORDER — SULFAMETHOXAZOLE-TRIMETHOPRIM 800-160 MG PO TABS
1.0000 | ORAL_TABLET | Freq: Two times a day (BID) | ORAL | 0 refills | Status: AC
  Filled 2023-09-30: qty 8, 4d supply, fill #0

## 2023-09-30 NOTE — Discharge Summary (Signed)
 DISCHARGE SUMMARY    Cindy Meyer FMW:[id number] DOB: 11-04-1926 DOA: 09/28/2023  PCP: Sadie Manna, MD  Admit date: 09/28/2023 Discharge date: 09/30/2023   Recommendations for Outpatient Follow-up:  Follow up with PCP in 1-2 weeks to recheck kidney function, and continue antihypertensive management.  Hospital Course: Cindy Meyer is a 89 year old female with hypertension, presents with fatigue, generalized weakness, increased urinary frequency.  On arrival to the ED she was tachycardic, temperature 99.8, labs reveal leukocytosis of 11.8 and creatinine of 1.4.  UA consistent with UTI lower urinary tract symptoms.  Patient was admitted for treatment.  Blood cultures later resulted positive for E. coli.  She was started on ceftriaxone  and improved significantly.  Urine cultures revealed pansensitive E. coli with resistance only to ampicillin.  It is presumed that blood cultures will have similar resistance as her bacteremia thought to be secondary to UTI.  After thorough discussion with infectious disease we will proceed with Bactrim  for an additional 4 days at discharge to complete 7-day course of antibiotic therapy.  Home health was ordered and arranged prior to discharge.  On day of discharge I discussed care plan directly with the patient, her daughter, and her granddaughter.  UTI Bacteremia - Urine and blood cultures now both growing E. coli, urine culture sensitive to Bactrim .  Presumed blood cultures will be similar. - Status post 3 days ceftriaxone , complete with 4 days of Bactrim  outpatient to complete 7-day course - Follow-up blood culture sensitivities   Generalized weakness and fall - PT/OT evaluations, home health ordered. - TSH, hemoglobin A1c, B12, vitamin D  WNL   Hypokalemia - Replace as needed   AKI - Baseline creatinine 0.67, creatinine 1.4 on arrival - Downtrending now - Continue to monitor CMP - At home on losartan  and HCTZ some evidence of dehydration  on arrival likely secondary to diuretics - Can resume home losartan  gradually.  Recommend discontinuation of HCTZ   Hypertension - Given advanced age can liberalize SBP goal up to 150, discussed this with the patient and her daughter. - Recommend discontinuation of HCTZ for now.  Keep blood pressure log and follow-up with PCP to further discuss antihypertensive regimen  Discharge Instructions  Discharge Instructions     Call MD for:  difficulty breathing, headache or visual disturbances   Complete by: As directed    Call MD for:  persistant dizziness or light-headedness   Complete by: As directed    Call MD for:  persistant nausea and vomiting   Complete by: As directed    Call MD for:  severe uncontrolled pain   Complete by: As directed    Call MD for:  temperature >100.4   Complete by: As directed    Diet general   Complete by: As directed    Discharge instructions   Complete by: As directed    While admitted we made some changes to your blood pressure medications. Please review your discharge medications closely to ensure you are taking the correct meds at home. Please take your blood pressure once a day and keep a log. See your primary care doctor in one week to review this log and make further medication changes.   Increase activity slowly   Complete by: As directed       Allergies as of 09/30/2023   No Known Allergies      Medication List     STOP taking these medications    enoxaparin  30 MG/0.3ML injection Commonly known as: LOVENOX    hydrochlorothiazide  12.5 MG  tablet Commonly known as: HYDRODIURIL        TAKE these medications    bisacodyl  10 MG suppository Commonly known as: DULCOLAX Place 1 suppository (10 mg total) rectally daily as needed for moderate constipation.   docusate sodium  100 MG capsule Commonly known as: COLACE Take 1 capsule (100 mg total) by mouth 2 (two) times daily.   ferrous fumarate-b12-vitamic C-folic acid capsule Commonly  known as: TRINSICON / FOLTRIN Take 1 capsule by mouth 2 (two) times daily after a meal.   losartan  25 MG tablet Commonly known as: COZAAR  Take 25 mg by mouth daily.   magnesium  hydroxide 400 MG/5ML suspension Commonly known as: MILK OF MAGNESIA Take 30 mLs by mouth daily as needed for mild constipation.   mirtazapine  7.5 MG tablet Commonly known as: REMERON  Take 7.5 mg by mouth at bedtime.   MULTIVITAMIN ADULT (MINERALS) PO Take 1 capsule by mouth daily.   sulfamethoxazole -trimethoprim  800-160 MG tablet Commonly known as: Bactrim  DS Take 1 tablet by mouth 2 (two) times daily for 4 days.        No Known Allergies  Consultations:    Procedures/Studies: CT Head Wo Contrast Result Date: 09/28/2023 CLINICAL DATA:  Generalized weakness and fatigue.  Status post fall. EXAM: CT HEAD WITHOUT CONTRAST CT CERVICAL SPINE WITHOUT CONTRAST TECHNIQUE: Multidetector CT imaging of the head and cervical spine was performed following the standard protocol without intravenous contrast. Multiplanar CT image reconstructions of the cervical spine were also generated. RADIATION DOSE REDUCTION: This exam was performed according to the departmental dose-optimization program which includes automated exposure control, adjustment of the mA and/or kV according to patient size and/or use of iterative reconstruction technique. COMPARISON:  No comparison studies available. FINDINGS: CT HEAD FINDINGS Brain: There is no evidence for acute hemorrhage, hydrocephalus, mass lesion, or abnormal extra-axial fluid collection. No definite CT evidence for acute infarction. Diffuse loss of parenchymal volume is consistent with atrophy. Patchy low attenuation in the deep hemispheric and periventricular white matter is nonspecific, but likely reflects chronic microvascular ischemic demyelination. Mineralization of the basal ganglia noted bilaterally. Vascular: No hyperdense vessel or unexpected calcification. Skull: No evidence  for fracture. No worrisome lytic or sclerotic lesion. Sinuses/Orbits: Trace frothy mucosal disease is identified in the left sphenoid sinus. Remaining visualized sinuses are clear. Visualized portions of the globes and intraorbital fat are unremarkable. Other: None. CT CERVICAL SPINE FINDINGS Alignment: Normal. Skull base and vertebrae: No acute fracture. No primary bone lesion or focal pathologic process. Soft tissues and spinal canal: Multinodular thyroid evident with 1.7 cm left thyroid nodule on image 70. Otherwise unremarkable. Disc levels: Loss of disc height with endplate degeneration noted at C2-3, C3-4, and C4-5. Facets are well aligned bilaterally. Upper chest: Biapical pleuroparenchymal scarring evident. Other: None. IMPRESSION: 1. No acute intracranial abnormality. 2. Atrophy with chronic small vessel ischemic disease. 3. No evidence for cervical spine fracture or subluxation. 4. 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 5. Multinodular thyroid with 1.7 cm left thyroid nodule. Recommend thyroid US  (ref: J Am Coll Radiol. 2015 Feb;12(2): 143-50). Electronically Signed   By: Camellia Candle M.D.   On: 09/28/2023 07:18   CT Cervical Spine Wo Contrast Result Date: 09/28/2023 CLINICAL DATA:  Generalized weakness and fatigue.  Status post fall. EXAM: CT HEAD WITHOUT CONTRAST CT CERVICAL SPINE WITHOUT CONTRAST TECHNIQUE: Multidetector CT imaging of the head and cervical spine was performed following the standard protocol without intravenous contrast. Multiplanar CT image reconstructions of the cervical spine were also generated. RADIATION  DOSE REDUCTION: This exam was performed according to the departmental dose-optimization program which includes automated exposure control, adjustment of the mA and/or kV according to patient size and/or use of iterative reconstruction technique. COMPARISON:  No comparison studies available. FINDINGS: CT HEAD FINDINGS Brain: There is no evidence for acute  hemorrhage, hydrocephalus, mass lesion, or abnormal extra-axial fluid collection. No definite CT evidence for acute infarction. Diffuse loss of parenchymal volume is consistent with atrophy. Patchy low attenuation in the deep hemispheric and periventricular white matter is nonspecific, but likely reflects chronic microvascular ischemic demyelination. Mineralization of the basal ganglia noted bilaterally. Vascular: No hyperdense vessel or unexpected calcification. Skull: No evidence for fracture. No worrisome lytic or sclerotic lesion. Sinuses/Orbits: Trace frothy mucosal disease is identified in the left sphenoid sinus. Remaining visualized sinuses are clear. Visualized portions of the globes and intraorbital fat are unremarkable. Other: None. CT CERVICAL SPINE FINDINGS Alignment: Normal. Skull base and vertebrae: No acute fracture. No primary bone lesion or focal pathologic process. Soft tissues and spinal canal: Multinodular thyroid evident with 1.7 cm left thyroid nodule on image 70. Otherwise unremarkable. Disc levels: Loss of disc height with endplate degeneration noted at C2-3, C3-4, and C4-5. Facets are well aligned bilaterally. Upper chest: Biapical pleuroparenchymal scarring evident. Other: None. IMPRESSION: 1. No acute intracranial abnormality. 2. Atrophy with chronic small vessel ischemic disease. 3. No evidence for cervical spine fracture or subluxation. 4. 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 5. Multinodular thyroid with 1.7 cm left thyroid nodule. Recommend thyroid US  (ref: J Am Coll Radiol. 2015 Feb;12(2): 143-50). Electronically Signed   By: Camellia Candle M.D.   On: 09/28/2023 07:18   DG Chest Port 1 View Result Date: 09/28/2023 CLINICAL DATA:  Sepsis.  Weakness. EXAM: PORTABLE CHEST 1 VIEW COMPARISON:  07/10/2018 FINDINGS: Lungs are hyperinflated. The lungs are clear without focal pneumonia, edema, pneumothorax or pleural effusion. Interstitial markings are diffusely coarsened  with chronic features. Biapical pleuroparenchymal scarring evident. Bones are diffusely demineralized. Telemetry leads overlie the chest. IMPRESSION: Hyperinflation with chronic interstitial coarsening. No acute cardiopulmonary findings. Electronically Signed   By: Camellia Candle M.D.   On: 09/28/2023 07:08      Discharge Exam: Vitals:   09/30/23 0827 09/30/23 1501  BP: (!) 144/81 124/72  Pulse: 92 80  Resp: 18 16  Temp: 97.9 F (36.6 C) 98.2 F (36.8 C)  SpO2: 100% 100%   Vitals:   09/29/23 2037 09/30/23 0502 09/30/23 0827 09/30/23 1501  BP: (!) 143/77 (!) 147/94 (!) 144/81 124/72  Pulse: 93 96 92 80  Resp: 18 16 18 16   Temp: 97.9 F (36.6 C) 97.6 F (36.4 C) 97.9 F (36.6 C) 98.2 F (36.8 C)  TempSrc:      SpO2: 99% 99% 100% 100%  Weight:      Height:        Constitutional:  Normal appearance. Non toxic-appearing.  HENT: Head Normocephalic and atraumatic.  Mucous membranes are moist.  Eyes:  Extraocular intact. Conjunctivae normal.  Cardiovascular: Rate and Rhythm: Normal rate and regular rhythm.  Pulmonary: Non labored, symmetric rise of chest wall.  Skin: warm and dry. not jaundiced.  Neurological: No focal deficit present. alert. Oriented.  Psychiatric: Mood and Affect congruent.    The results of significant diagnostics from this hospitalization (including imaging, microbiology, ancillary and laboratory) are listed below for reference.     Microbiology: Recent Results (from the past 240 hours)  Culture, blood (routine x 2)     Status: None (  Preliminary result)   Collection Time: 09/28/23  6:10 AM   Specimen: BLOOD  Result Value Ref Range Status   Specimen Description   Final    BLOOD RIGHT FA Performed at [facility], [address], [place], [place] [address]    Special Requests   Final    BOTTLES DRAWN AEROBIC AND ANAEROBIC Blood Culture adequate volume Performed at [facility], [address] [place], [place] [address]     Culture  Setup Time   Final    GRAM NEGATIVE RODS AEROBIC BOTTLE ONLY CRITICAL VALUE NOTED.  VALUE IS CONSISTENT WITH PREVIOUSLY REPORTED AND CALLED VALUE. Performed at [facility], [address], [place], [place] [address]    Culture   Final    VONNE NEGATIVE RODS IDENTIFICATION TO FOLLOW Performed at [facility], [address], [place], [place] [address]    Report Status PENDING  Incomplete  Culture, blood (routine x 2)     Status: Abnormal (Preliminary result)   Collection Time: 09/28/23  6:10 AM   Specimen: BLOOD  Result Value Ref Range Status   Specimen Description   Final    BLOOD RIGHT HAND Performed at [facility], [address] [place], [place] [address]    Special Requests   Final    BOTTLES DRAWN AEROBIC ONLY Blood Culture results may not be optimal due to an inadequate volume of blood received in culture bottles Performed at [facility], [address] [place], [place] [address]    Culture  Setup Time   Final    GRAM NEGATIVE RODS AEROBIC BOTTLE ONLY CRITICAL RESULT CALLED TO, READ BACK BY AND VERIFIED WITH: WILL ANDERSON 2018 09/28/23 MU    Culture (A)  Final    ESCHERICHIA COLI SUSCEPTIBILITIES TO FOLLOW Performed at [facility], [address] [place], [place] [address]    Report Status PENDING  Incomplete  Blood Culture ID Panel (Reflexed)     Status: Abnormal   Collection Time: 09/28/23  6:10 AM  Result Value Ref Range Status   Enterococcus faecalis NOT DETECTED NOT DETECTED Final   Enterococcus Faecium NOT DETECTED NOT DETECTED Final   Listeria monocytogenes NOT DETECTED NOT DETECTED Final   Staphylococcus species NOT DETECTED NOT DETECTED Final   Staphylococcus aureus (BCID) NOT DETECTED NOT DETECTED Final   Staphylococcus epidermidis NOT DETECTED NOT DETECTED Final   Staphylococcus lugdunensis NOT DETECTED NOT DETECTED Final   Streptococcus species NOT DETECTED NOT DETECTED Final   Streptococcus  agalactiae NOT DETECTED NOT DETECTED Final   Streptococcus pneumoniae NOT DETECTED NOT DETECTED Final   Streptococcus pyogenes NOT DETECTED NOT DETECTED Final   A.calcoaceticus-baumannii NOT DETECTED NOT DETECTED Final   Bacteroides fragilis NOT DETECTED NOT DETECTED Final   Enterobacterales DETECTED (A) NOT DETECTED Final    Comment: Enterobacterales represent a large order of gram negative bacteria, not a single organism. CRITICAL RESULT CALLED TO, READ BACK BY AND VERIFIED WITH: WILL ANDERSON 2018 09/28/23 MU    Enterobacter cloacae complex NOT DETECTED NOT DETECTED Final   Escherichia coli DETECTED (A) NOT DETECTED Final    Comment: CRITICAL RESULT CALLED TO, READ BACK BY AND VERIFIED WITH: WILL ANDERSON 2018 09/28/23 MU    Klebsiella aerogenes NOT DETECTED NOT DETECTED Final   Klebsiella oxytoca NOT DETECTED NOT DETECTED Final   Klebsiella pneumoniae NOT DETECTED NOT DETECTED Final   Proteus species NOT DETECTED NOT DETECTED Final   Salmonella species NOT DETECTED NOT DETECTED Final  Serratia marcescens NOT DETECTED NOT DETECTED Final   Haemophilus influenzae NOT DETECTED NOT DETECTED Final   Neisseria meningitidis NOT DETECTED NOT DETECTED Final   Pseudomonas aeruginosa NOT DETECTED NOT DETECTED Final   Stenotrophomonas maltophilia NOT DETECTED NOT DETECTED Final   Candida albicans NOT DETECTED NOT DETECTED Final   Candida auris NOT DETECTED NOT DETECTED Final   Candida glabrata NOT DETECTED NOT DETECTED Final   Candida krusei NOT DETECTED NOT DETECTED Final   Candida parapsilosis NOT DETECTED NOT DETECTED Final   Candida tropicalis NOT DETECTED NOT DETECTED Final   Cryptococcus neoformans/gattii NOT DETECTED NOT DETECTED Final   CTX-M ESBL NOT DETECTED NOT DETECTED Final   Carbapenem resistance IMP NOT DETECTED NOT DETECTED Final   Carbapenem resistance KPC NOT DETECTED NOT DETECTED Final   Carbapenem resistance NDM NOT DETECTED NOT DETECTED Final   Carbapenem resist OXA 48  LIKE NOT DETECTED NOT DETECTED Final   Carbapenem resistance VIM NOT DETECTED NOT DETECTED Final    Comment: Performed at [facility], [address], [place], [place] [address]  Urine Culture     Status: Abnormal   Collection Time: 09/28/23  6:42 AM   Specimen: Urine, Random  Result Value Ref Range Status   Specimen Description   Final    URINE, RANDOM Performed at [facility], [address] [place], [place] [address]    Special Requests   Final    NONE Reflexed from [id number] Performed at [facility], [address] [place], [place] [address]    Culture >=100,000 COLONIES/mL ESCHERICHIA COLI (A)  Final   Report Status 09/30/2023 FINAL  Final   Organism ID, Bacteria ESCHERICHIA COLI (A)  Final      Susceptibility   Escherichia coli - MIC*    AMPICILLIN >=32 RESISTANT Resistant     CEFAZOLIN  (URINE) Value in next row Sensitive      4 SENSITIVEThis is a modified FDA-approved test that has been validated and its performance characteristics determined by the reporting laboratory.  This laboratory is certified under the Clinical Laboratory Improvement Amendments CLIA as qualified to perform high complexity clinical laboratory testing.    CEFEPIME Value in next row Sensitive      4 SENSITIVEThis is a modified FDA-approved test that has been validated and its performance characteristics determined by the reporting laboratory.  This laboratory is certified under the Clinical Laboratory Improvement Amendments CLIA as qualified to perform high complexity clinical laboratory testing.    ERTAPENEM Value in next row Sensitive      4 SENSITIVEThis is a modified FDA-approved test that has been validated and its performance characteristics determined by the reporting laboratory.  This laboratory is certified under the Clinical Laboratory Improvement Amendments CLIA as qualified to perform high complexity clinical laboratory testing.    CEFTRIAXONE  Value in next row  Sensitive      4 SENSITIVEThis is a modified FDA-approved test that has been validated and its performance characteristics determined by the reporting laboratory.  This laboratory is certified under the Clinical Laboratory Improvement Amendments CLIA as qualified to perform high complexity clinical laboratory testing.    CIPROFLOXACIN Value in next row Sensitive      4 SENSITIVEThis is a modified FDA-approved test that has been validated and its performance characteristics determined by the reporting laboratory.  This laboratory is certified under the Clinical Laboratory Improvement Amendments CLIA as qualified to perform high complexity clinical laboratory testing.    GENTAMICIN Value in next row Sensitive  4 SENSITIVEThis is a modified FDA-approved test that has been validated and its performance characteristics determined by the reporting laboratory.  This laboratory is certified under the Clinical Laboratory Improvement Amendments CLIA as qualified to perform high complexity clinical laboratory testing.    NITROFURANTOIN Value in next row Sensitive      4 SENSITIVEThis is a modified FDA-approved test that has been validated and its performance characteristics determined by the reporting laboratory.  This laboratory is certified under the Clinical Laboratory Improvement Amendments CLIA as qualified to perform high complexity clinical laboratory testing.    TRIMETH /SULFA  Value in next row Sensitive      4 SENSITIVEThis is a modified FDA-approved test that has been validated and its performance characteristics determined by the reporting laboratory.  This laboratory is certified under the Clinical Laboratory Improvement Amendments CLIA as qualified to perform high complexity clinical laboratory testing.    AMPICILLIN/SULBACTAM Value in next row Sensitive      4 SENSITIVEThis is a modified FDA-approved test that has been validated and its performance characteristics determined by the reporting  laboratory.  This laboratory is certified under the Clinical Laboratory Improvement Amendments CLIA as qualified to perform high complexity clinical laboratory testing.    PIP/TAZO Value in next row Sensitive ug/mL     <=4 SENSITIVEThis is a modified FDA-approved test that has been validated and its performance characteristics determined by the reporting laboratory.  This laboratory is certified under the Clinical Laboratory Improvement Amendments CLIA as qualified to perform high complexity clinical laboratory testing.    MEROPENEM Value in next row Sensitive      <=4 SENSITIVEThis is a modified FDA-approved test that has been validated and its performance characteristics determined by the reporting laboratory.  This laboratory is certified under the Clinical Laboratory Improvement Amendments CLIA as qualified to perform high complexity clinical laboratory testing.    * >=100,000 COLONIES/mL ESCHERICHIA COLI     Labs: BNP (last 3 results) No results for input(s): BNP in the last 8760 hours. Basic Metabolic Panel: Recent Labs  Lab 09/28/23 0610 09/29/23 0437 09/30/23 0412  NA 141 142 141  K 3.1* 3.5 3.6  CL 104 108 108  CO2 26 25 25   GLUCOSE 144* 120* 110*  BUN 25* 21 19  CREATININE 1.45* 1.02* 1.05*  CALCIUM 9.0 8.8* 8.7*  MG  --  2.2 2.2  PHOS  --   --  2.5   Liver Function Tests: Recent Labs  Lab 09/30/23 0412  AST 48*  ALT 17  ALKPHOS 49  BILITOT 0.4  PROT 6.8  ALBUMIN 2.8*   No results for input(s): LIPASE, AMYLASE in the last 168 hours. No results for input(s): AMMONIA in the last 168 hours. CBC: Recent Labs  Lab 09/28/23 0610 09/29/23 0437 09/30/23 0412  WBC 11.8* 9.7 7.9  NEUTROABS  --   --  5.6  HGB 10.7* 10.4* 10.1*  HCT 33.6* 32.9* 32.3*  MCV 86.4 86.8 88.0  PLT 228 221 225   Cardiac Enzymes: Recent Labs  Lab 09/28/23 0610  CKTOTAL 46   BNP: Invalid input(s): POCBNP CBG: No results for input(s): GLUCAP in the last 168  hours. D-Dimer No results for input(s): DDIMER in the last 72 hours. Hgb A1c Recent Labs    09/28/23 0610  HGBA1C 5.5   Lipid Profile No results for input(s): CHOL, HDL, LDLCALC, TRIG, CHOLHDL, LDLDIRECT in the last 72 hours. Thyroid function studies Recent Labs    09/28/23 1241  TSH 0.449   Anemia work  up Recent Labs    09/28/23 1241  VITAMINB12 1,246*   Urinalysis    Component Value Date/Time   COLORURINE YELLOW (A) 09/28/2023 0642   APPEARANCEUR CLOUDY (A) 09/28/2023 0642   LABSPEC 1.009 09/28/2023 0642   PHURINE 6.0 09/28/2023 0642   GLUCOSEU NEGATIVE 09/28/2023 0642   HGBUR MODERATE (A) 09/28/2023 0642   BILIRUBINUR NEGATIVE 09/28/2023 0642   KETONESUR NEGATIVE 09/28/2023 0642   PROTEINUR 100 (A) 09/28/2023 0642   NITRITE NEGATIVE 09/28/2023 0642   LEUKOCYTESUR LARGE (A) 09/28/2023 0642   Sepsis Labs Recent Labs  Lab 09/28/23 0610 09/29/23 0437 09/30/23 0412  WBC 11.8* 9.7 7.9   Microbiology Recent Results (from the past 240 hours)  Culture, blood (routine x 2)     Status: None (Preliminary result)   Collection Time: 09/28/23  6:10 AM   Specimen: BLOOD  Result Value Ref Range Status   Specimen Description   Final    BLOOD RIGHT FA Performed at [facility], [address], [place], [place] [address]    Special Requests   Final    BOTTLES DRAWN AEROBIC AND ANAEROBIC Blood Culture adequate volume Performed at [facility], [address] [place], [place] [address]    Culture  Setup Time   Final    GRAM NEGATIVE RODS AEROBIC BOTTLE ONLY CRITICAL VALUE NOTED.  VALUE IS CONSISTENT WITH PREVIOUSLY REPORTED AND CALLED VALUE. Performed at [facility], [address], [place], [place] [address]    Culture   Final    VONNE NEGATIVE RODS IDENTIFICATION TO FOLLOW Performed at [facility], [address] [place], [place] [address]    Report Status PENDING  Incomplete  Culture, blood (routine x 2)      Status: Abnormal (Preliminary result)   Collection Time: 09/28/23  6:10 AM   Specimen: BLOOD  Result Value Ref Range Status   Specimen Description   Final    BLOOD RIGHT HAND Performed at [facility], [address] [place], [place] [address]    Special Requests   Final    BOTTLES DRAWN AEROBIC ONLY Blood Culture results may not be optimal due to an inadequate volume of blood received in culture bottles Performed at [facility], [address] [place], [place] [address]    Culture  Setup Time   Final    GRAM NEGATIVE RODS AEROBIC BOTTLE ONLY CRITICAL RESULT CALLED TO, READ BACK BY AND VERIFIED WITH: WILL ANDERSON 2018 09/28/23 MU    Culture (A)  Final    ESCHERICHIA COLI SUSCEPTIBILITIES TO FOLLOW Performed at [facility] [facility], [address], [place], [place] [address]    Report Status PENDING  Incomplete  Blood Culture ID Panel (Reflexed)     Status: Abnormal   Collection Time: 09/28/23  6:10 AM  Result Value Ref Range Status   Enterococcus faecalis NOT DETECTED NOT DETECTED Final   Enterococcus Faecium NOT DETECTED NOT DETECTED Final   Listeria monocytogenes NOT DETECTED NOT DETECTED Final   Staphylococcus species NOT DETECTED NOT DETECTED Final   Staphylococcus aureus (BCID) NOT DETECTED NOT DETECTED Final   Staphylococcus epidermidis NOT DETECTED NOT DETECTED Final   Staphylococcus lugdunensis NOT DETECTED NOT DETECTED Final   Streptococcus species NOT DETECTED NOT DETECTED Final   Streptococcus agalactiae NOT DETECTED NOT DETECTED Final   Streptococcus pneumoniae NOT DETECTED NOT DETECTED Final   Streptococcus pyogenes NOT DETECTED NOT DETECTED Final   A.calcoaceticus-baumannii NOT DETECTED NOT DETECTED Final   Bacteroides  fragilis NOT DETECTED NOT DETECTED Final   Enterobacterales DETECTED (A) NOT DETECTED Final    Comment: Enterobacterales represent a large order of gram negative bacteria, not a single organism. CRITICAL RESULT CALLED TO, READ  BACK BY AND VERIFIED WITH: WILL ANDERSON 2018 09/28/23 MU    Enterobacter cloacae complex NOT DETECTED NOT DETECTED Final   Escherichia coli DETECTED (A) NOT DETECTED Final    Comment: CRITICAL RESULT CALLED TO, READ BACK BY AND VERIFIED WITH: WILL ANDERSON 2018 09/28/23 MU    Klebsiella aerogenes NOT DETECTED NOT DETECTED Final   Klebsiella oxytoca NOT DETECTED NOT DETECTED Final   Klebsiella pneumoniae NOT DETECTED NOT DETECTED Final   Proteus species NOT DETECTED NOT DETECTED Final   Salmonella species NOT DETECTED NOT DETECTED Final   Serratia marcescens NOT DETECTED NOT DETECTED Final   Haemophilus influenzae NOT DETECTED NOT DETECTED Final   Neisseria meningitidis NOT DETECTED NOT DETECTED Final   Pseudomonas aeruginosa NOT DETECTED NOT DETECTED Final   Stenotrophomonas maltophilia NOT DETECTED NOT DETECTED Final   Candida albicans NOT DETECTED NOT DETECTED Final   Candida auris NOT DETECTED NOT DETECTED Final   Candida glabrata NOT DETECTED NOT DETECTED Final   Candida krusei NOT DETECTED NOT DETECTED Final   Candida parapsilosis NOT DETECTED NOT DETECTED Final   Candida tropicalis NOT DETECTED NOT DETECTED Final   Cryptococcus neoformans/gattii NOT DETECTED NOT DETECTED Final   CTX-M ESBL NOT DETECTED NOT DETECTED Final   Carbapenem resistance IMP NOT DETECTED NOT DETECTED Final   Carbapenem resistance KPC NOT DETECTED NOT DETECTED Final   Carbapenem resistance NDM NOT DETECTED NOT DETECTED Final   Carbapenem resist OXA 48 LIKE NOT DETECTED NOT DETECTED Final   Carbapenem resistance VIM NOT DETECTED NOT DETECTED Final    Comment: Performed at [facility], [address], [place], [place] [address]  Urine Culture     Status: Abnormal   Collection Time: 09/28/23  6:42 AM   Specimen: Urine, Random  Result Value Ref Range Status   Specimen Description   Final    URINE, RANDOM Performed at [facility], [address] [place], [place] [address]     Special Requests   Final    NONE Reflexed from [id number] Performed at [facility], [address] [place], [place] [address]    Culture >=100,000 COLONIES/mL ESCHERICHIA COLI (A)  Final   Report Status 09/30/2023 FINAL  Final   Organism ID, Bacteria ESCHERICHIA COLI (A)  Final      Susceptibility   Escherichia coli - MIC*    AMPICILLIN >=32 RESISTANT Resistant     CEFAZOLIN  (URINE) Value in next row Sensitive      4 SENSITIVEThis is a modified FDA-approved test that has been validated and its performance characteristics determined by the reporting laboratory.  This laboratory is certified under the Clinical Laboratory Improvement Amendments CLIA as qualified to perform high complexity clinical laboratory testing.    CEFEPIME Value in next row Sensitive      4 SENSITIVEThis is a modified FDA-approved test that has been validated and its performance characteristics determined by the reporting laboratory.  This laboratory is certified under the Clinical Laboratory Improvement Amendments CLIA as qualified to perform high complexity clinical laboratory testing.    ERTAPENEM Value in next row Sensitive      4 SENSITIVEThis is a modified FDA-approved test that has been validated and its performance characteristics determined by the reporting laboratory.  This laboratory is certified under the Clinical Laboratory  Improvement Amendments CLIA as qualified to perform high complexity clinical laboratory testing.    CEFTRIAXONE  Value in next row Sensitive      4 SENSITIVEThis is a modified FDA-approved test that has been validated and its performance characteristics determined by the reporting laboratory.  This laboratory is certified under the Clinical Laboratory Improvement Amendments CLIA as qualified to perform high complexity clinical laboratory testing.    CIPROFLOXACIN Value in next row Sensitive      4 SENSITIVEThis is a modified FDA-approved test that has been validated and its performance  characteristics determined by the reporting laboratory.  This laboratory is certified under the Clinical Laboratory Improvement Amendments CLIA as qualified to perform high complexity clinical laboratory testing.    GENTAMICIN Value in next row Sensitive      4 SENSITIVEThis is a modified FDA-approved test that has been validated and its performance characteristics determined by the reporting laboratory.  This laboratory is certified under the Clinical Laboratory Improvement Amendments CLIA as qualified to perform high complexity clinical laboratory testing.    NITROFURANTOIN Value in next row Sensitive      4 SENSITIVEThis is a modified FDA-approved test that has been validated and its performance characteristics determined by the reporting laboratory.  This laboratory is certified under the Clinical Laboratory Improvement Amendments CLIA as qualified to perform high complexity clinical laboratory testing.    TRIMETH /SULFA  Value in next row Sensitive      4 SENSITIVEThis is a modified FDA-approved test that has been validated and its performance characteristics determined by the reporting laboratory.  This laboratory is certified under the Clinical Laboratory Improvement Amendments CLIA as qualified to perform high complexity clinical laboratory testing.    AMPICILLIN/SULBACTAM Value in next row Sensitive      4 SENSITIVEThis is a modified FDA-approved test that has been validated and its performance characteristics determined by the reporting laboratory.  This laboratory is certified under the Clinical Laboratory Improvement Amendments CLIA as qualified to perform high complexity clinical laboratory testing.    PIP/TAZO Value in next row Sensitive ug/mL     <=4 SENSITIVEThis is a modified FDA-approved test that has been validated and its performance characteristics determined by the reporting laboratory.  This laboratory is certified under the Clinical Laboratory Improvement Amendments CLIA as qualified  to perform high complexity clinical laboratory testing.    MEROPENEM Value in next row Sensitive      <=4 SENSITIVEThis is a modified FDA-approved test that has been validated and its performance characteristics determined by the reporting laboratory.  This laboratory is certified under the Clinical Laboratory Improvement Amendments CLIA as qualified to perform high complexity clinical laboratory testing.    * >=100,000 COLONIES/mL ESCHERICHIA COLI     Time coordinating discharge: 32 min   SIGNED: Lakoda Raske, DO [facility] 09/30/2023, 4:26 PM Pager   If 7PM-7AM, please contact night-coverage

## 2023-09-30 NOTE — Progress Notes (Signed)
 Mobility Specialist - Progress Note   09/30/23 1249  Mobility  Activity Pivoted/transferred to/from BSC;Stood at bedside  Level of Assistance Contact guard assist, steadying assist  Assistive Device None  Distance Ambulated (ft) 4 ft  Activity Response Tolerated well  Mobility visit 1 Mobility  Mobility Specialist Start Time (ACUTE ONLY) 1224  Mobility Specialist Stop Time (ACUTE ONLY) 1231  Mobility Specialist Time Calculation (min) (ACUTE ONLY) 7 min   Pt semi fowler upon entry, utilizing RA. Pt completed bed mob and STS to RW MinA, transferred to the [facility] via SPT CGA no AD. Pt left seated on the [facility] with instructions to utilize call bell when finished.   America Silvan Mobility Specialist 09/30/23 12:57 PM

## 2023-09-30 NOTE — Progress Notes (Deleted)
 Mobility Specialist - Progress Note   09/30/23 1249  Mobility  Activity Mechanically lifted to/from BSC  Level of Assistance Contact guard assist, steadying assist  Assistive Device None  Distance Ambulated (ft) 4 ft  Activity Response Tolerated well  Mobility visit 1 Mobility  Mobility Specialist Start Time (ACUTE ONLY) 1224  Mobility Specialist Stop Time (ACUTE ONLY) 1231  Mobility Specialist Time Calculation (min) (ACUTE ONLY) 7 min   Pt semi fowler upon entry, utilizing RA. Pt completed bed mob and STS to RW MinA, transferred to the [facility] via SPT CGA no AD. Pt left seated on the [facility] with instructions to utilize call bell when finished.  America Silvan Mobility Specialist 09/30/23 12:54 PM

## 2023-09-30 NOTE — Progress Notes (Signed)
 Occupational Therapy Treatment Patient Details Name: Cindy Meyer MRN: [id number] DOB: January 04, 1927 Today's Date: 09/30/2023   History of present illness Pt is as 89 yo female that presented to ED for weakness, family reported fall at home, increased urinary frequency, workup for UTI, mild AKI. PMH of HTN, IM nailing of R hip 2020.   OT comments  Upon entering the room, pt supine in bed and agreeable to OT intervention. Pt declines use of toileting but as pt begins moving towards EOB she urgently reports need to use bathroom. Min A for stand pivot transfer to [facility] with RW. Pt able to void and performs hygiene with set up A while seated. Pt doffs soiled brief with assistance and dons clean brief with CGA for balance. Pt stands and pulls over B hips with min guard for balance and ambulates to door and then to recliner chair ~ 25' with min guard and RW. Pt seated in recliner chair with call bell and granddaughter present in room. Chair alarm activated and all needs within reach.       If plan is discharge home, recommend the following:  A little help with walking and/or transfers;A little help with bathing/dressing/bathroom;Assistance with cooking/housework;Supervision due to cognitive status;Assist for transportation;Help with stairs or ramp for entrance   Equipment Recommendations  None recommended by OT       Precautions / Restrictions Precautions Precautions: Fall       Mobility Bed Mobility Overal bed mobility: Needs Assistance Bed Mobility: Supine to Sit     Supine to sit: Supervision, HOB elevated          Transfers Overall transfer level: Needs assistance Equipment used: Rolling walker (2 wheels) Transfers: Sit to/from Stand Sit to Stand: Min assist                 Balance Overall balance assessment: Needs assistance Sitting-balance support: Feet supported       Standing balance support: Bilateral upper extremity supported, No upper extremity  supported Standing balance-Leahy Scale: Fair                             ADL either performed or assessed with clinical judgement   ADL Overall ADL's : Needs assistance/impaired                         Toilet Transfer: Minimal assistance;Rolling walker (2 wheels);BSC/3in1;Stand-pivot   Toileting- Clothing Manipulation and Hygiene: Minimal assistance;Sit to/from stand              Extremity/Trunk Assessment Upper Extremity Assessment Upper Extremity Assessment: Generalized weakness   Lower Extremity Assessment Lower Extremity Assessment: Generalized weakness        Vision Patient Visual Report: No change from baseline           Communication Communication Communication: Impaired Factors Affecting Communication: Hearing impaired   Cognition Arousal: Alert Behavior During Therapy: WFL for tasks assessed/performed Cognition: History of cognitive impairments                               Following commands: Impaired Following commands impaired: Follows one step commands with increased time      Cueing   Cueing Techniques: Verbal cues, Visual cues             Pertinent Vitals/ Pain       Pain Assessment Pain Assessment: No/denies  pain         Frequency  Min 2X/week        Progress Toward Goals  OT Goals(current goals can now be found in the care plan section)  Progress towards OT goals: Progressing toward goals      AM-PAC OT 6 Clicks Daily Activity     Outcome Measure   Help from another person eating meals?: None Help from another person taking care of personal grooming?: A Little Help from another person toileting, which includes using toliet, bedpan, or urinal?: A Little Help from another person bathing (including washing, rinsing, drying)?: A Little Help from another person to put on and taking off regular upper body clothing?: A Little Help from another person to put on and taking off regular lower  body clothing?: A Little 6 Click Score: 19    End of Session Equipment Utilized During Treatment: Rolling walker (2 wheels)  OT Visit Diagnosis: Unsteadiness on feet (R26.81);Muscle weakness (generalized) (M62.81)   Activity Tolerance Patient tolerated treatment well   Patient Left in bed;with call bell/phone within reach;with bed alarm set;with family/visitor present   Nurse Communication Mobility status        Time: 8592-8571 OT Time Calculation (min): 21 min  Charges: OT General Charges $OT Visit: 1 Visit OT Treatments $Self Care/Home Management : 8-22 mins  Izetta Claude, MS, OTR/L , CBIS ascom [phone]  09/30/23, 2:46 PM

## 2023-09-30 NOTE — Plan of Care (Signed)

## 2023-09-30 NOTE — TOC Progression Note (Signed)
 Transition of Care [facility]) - Progression Note    Patient Details  Name: Cindy Meyer MRN: [id number] Date of Birth: 12-04-1926  Transition of [facility]) CM/SW Contact  Corean ONEIDA Haddock, RN Phone Number: 09/30/2023, 1:18 PM  Clinical Narrative:     Per Asberry with IP Care Management Patient agreeable to home health services and did not have preference of agency. Referral made and accepted by Channing with [facility]    Expected Discharge Plan: Home w Home Health Services Barriers to Discharge: Continued Medical Work up               Expected Discharge Plan and Services                                               Social Drivers of Health (SDOH) Interventions SDOH Screenings   Food Insecurity: No Food Insecurity (09/28/2023)  Housing: Low Risk  (09/28/2023)  Transportation Needs: No Transportation Needs (09/28/2023)  Utilities: Not At Risk (09/28/2023)  Financial Resource Strain: Low Risk  (07/01/2023)   Received from [facility]  Social Connections: Socially Isolated (09/28/2023)  Tobacco Use: Low Risk  (09/28/2023)    Readmission Risk Interventions     No data to display

## 2023-09-30 NOTE — Discharge Instructions (Signed)
 [facility]  They will call you to schedule when they will come out to see you for nursing and Physical Therapy Home health care service [address]  [phone] Open ? Closes 5?PM

## 2023-09-30 NOTE — TOC Initial Note (Signed)
 Transition of Care [facility]) - Initial/Assessment Note    Patient Details  Name: Cindy Meyer MRN: [id number] Date of Birth: 11/07/1926  Transition of Care [facility]) CM/SW Contact:    Asberry CHRISTELLA Jaksch, RN Phone Number: 09/30/2023, 11:19 AM  Clinical Narrative:                 Admitted for: UTI Admitted from: Home PCP: Sadie Manna, MD Current home health/prior home health/DME: Per patient's daughter [name], she has a RW and BSC at home.  Spoke with patient and daughter [name] at bedside. Patient states she lives alone but that her daughter, granddaughter, and son are involved in her care and that they bring her to any medical appointments. Her daughter confirms she has a RW and BSC at home. I discussed that she has recommendations for [facility] PT/OT and patient and family are interested in setting up services. I will send out referrals to preferred agencies.   Expected Discharge Plan: Home w Home Health Services Barriers to Discharge: Continued Medical Work up   Patient Goals and CMS Choice Patient states their goals for this hospitalization and ongoing recovery are:: Going home          Expected Discharge Plan and Services                                              Prior Living Arrangements/Services   Lives with:: Self Patient language and need for interpreter reviewed:: Yes Do you feel safe going back to the place where you live?: Yes        Care giver support system in place?: Yes (comment) Current home services: DME (Per patient's daughter [name], she has a RW and BSC)    Activities of Daily Living   ADL Screening (condition at time of admission) Independently performs ADLs?: Yes (appropriate for developmental age) Is the patient deaf or have difficulty hearing?: No Does the patient have difficulty seeing, even when wearing glasses/contacts?: No Does the patient have difficulty concentrating, remembering, or making decisions?: Yes  Permission  Sought/Granted                  Emotional Assessment Appearance:: Appears stated age     Orientation: : Oriented to Self, Oriented to Place, Oriented to Situation Alcohol / Substance Use: Never Used    Admission diagnosis:  Urinary frequency [R35.0] Dehydration [E86.0] UTI (urinary tract infection) [N39.0] Tachycardia [R00.0] Generalized weakness [R53.1] AKI (acute kidney injury) (HCC) [N17.9] Patient Active Problem List   Diagnosis Date Noted   UTI (urinary tract infection) 09/28/2023   AKI (acute kidney injury) (HCC) 09/28/2023   Hypokalemia 09/28/2023   Falls 09/28/2023   Generalized weakness 09/28/2023   Hip fracture (HCC) 07/11/2018   HTN (hypertension) 07/11/2018   PCP:  Sadie Manna, MD Pharmacy:   [facility] [address], [place] - [address] [place] [address] Phone: [phone] Fax: [phone]     Social Drivers of Health (SDOH) Social History: SDOH Screenings   Food Insecurity: No Food Insecurity (09/28/2023)  Housing: Low Risk  (09/28/2023)  Transportation Needs: No Transportation Needs (09/28/2023)  Utilities: Not At Risk (09/28/2023)  Financial Resource Strain: Low Risk  (07/01/2023)   Received from [facility]  Social Connections: Socially Isolated (09/28/2023)  Tobacco Use: Low Risk  (09/28/2023)   SDOH Interventions:  Readmission Risk Interventions     No data to display

## 2023-10-01 LAB — CULTURE, BLOOD (ROUTINE X 2): Special Requests: ADEQUATE

## 2023-10-03 ENCOUNTER — Emergency Department

## 2023-10-03 ENCOUNTER — Other Ambulatory Visit: Payer: Self-pay

## 2023-10-03 ENCOUNTER — Emergency Department
Admission: EM | Admit: 2023-10-03 | Discharge: 2023-10-04 | Disposition: A | Discharge status: Home / Self Care | Attending: Emergency Medicine | Admitting: Emergency Medicine

## 2023-10-03 ENCOUNTER — Encounter: Payer: Self-pay | Admitting: Emergency Medicine

## 2023-10-03 DIAGNOSIS — I10 Essential (primary) hypertension: Secondary | ICD-10-CM | POA: Insufficient documentation

## 2023-10-03 DIAGNOSIS — S0990XA Unspecified injury of head, initial encounter: Secondary | ICD-10-CM | POA: Diagnosis present

## 2023-10-03 DIAGNOSIS — W01198A Fall on same level from slipping, tripping and stumbling with subsequent striking against other object, initial encounter: Secondary | ICD-10-CM | POA: Diagnosis not present

## 2023-10-03 NOTE — ED Triage Notes (Signed)
 Patient presents with family after mechanical fall where she hit her head on hoverboard at approx 2200. Denies blood thinners. Denies LOC or n/v. Oriented to self and place, disoriented to time and situation - daughter reports this is baseline. Denies neck pain - reports headache PTA.

## 2023-10-04 MED ORDER — ACETAMINOPHEN 325 MG PO TABS
650.0000 mg | ORAL_TABLET | Freq: Once | ORAL | Status: AC
  Administered 2023-10-04: 650 mg via ORAL
  Filled 2023-10-04: qty 2

## 2023-10-04 NOTE — ED Provider Notes (Signed)
 [facility] Provider Note    Event Date/Time   First MD Initiated Contact with Patient 10/03/23 2343     (approximate)   History   Fall   HPI  Cindy Meyer is a 89 year old female with history of hypertension presenting to the emergency department for evaluation after a fall.  Patient was outside when she had a witnessed episode of losing her balance, falling backwards and hitting her head on her grandsons hover board.  No LOC.  No preceding chest pain, shortness of breath, lightheadedness.  Patient was discharged from the hospital 4 days ago.  She was admitted at that time for weakness in the setting of UTI and bacteremia.  Denies any worsening symptoms since time of discharge.  Was reporting a headache prior to presentation leading him to present to the ER.  Not on anticoagulation.    Physical Exam   Triage Vital Signs: ED Triage Vitals  Encounter Vitals Group     BP 10/03/23 2217 (!) 188/60     Girls Systolic BP Percentile --      Girls Diastolic BP Percentile --      Boys Systolic BP Percentile --      Boys Diastolic BP Percentile --      Pulse Rate 10/03/23 2217 96     Resp 10/03/23 2217 16     Temp 10/03/23 2217 98.2 F (36.8 C)     Temp Source 10/03/23 2217 Oral     SpO2 10/03/23 2217 98 %     Weight 10/03/23 2216 131 lb 9.8 oz (59.7 kg)     Height 10/03/23 2216 5' 7 (1.702 m)     Head Circumference --      Peak Flow --      Pain Score 10/03/23 2216 0     Pain Loc --      Pain Education --      Exclude from Growth Chart --     Most recent vital signs: Vitals:   10/03/23 2217  BP: (!) 188/60  Pulse: 96  Resp: 16  Temp: 98.2 F (36.8 C)  SpO2: 98%    Nursing notes and vital signs reviewed.  General: Adult female, laying in bed, awake and interactive Head: Atraumatic Neck: No midline tenderness Chest: Symmetric chest rise, no tenderness to palpation.  Cardiac: Regular rhythm and rate.  Respiratory: Lungs clear to  auscultation Abdomen: Soft, nondistended. No tenderness to palpation.  Pelvis: Stable in AP and lateral compression. No tenderness to palpation. MSK: No deformity to bilateral upper and lower extremity. Full range of motion to bilateral upper lower extremity. Neuro: Alert, oriented. GCS 15. 5 out of 5 strength in bilateral upper and lower extremities. Normal sensation to light touch in bilateral upper and lower extremity. Skin: No evidence of burns or lacerations.   ED Results / Procedures / Treatments   Labs (all labs ordered are listed, but only abnormal results are displayed) Labs Reviewed - No data to display   EKG EKG independently reviewed and interpreted by myself demonstrates:    RADIOLOGY Imaging independently reviewed and interpreted by myself demonstrates:  CT head without acute bleed CT C-spine without acute fracture  Formal Radiology Read:  CT Head Wo Contrast Result Date: 10/03/2023 CLINICAL DATA:  Facial trauma, blunt Patient presents with family after mechanical fall where she hit her head on hoverboard at approx 2200. Denies blood thinners. Denies LOC or n/v. Oriented to self and place, disoriented to time and situation EXAM:  CT HEAD WITHOUT CONTRAST CT CERVICAL SPINE WITHOUT CONTRAST TECHNIQUE: Multidetector CT imaging of the head and cervical spine was performed following the standard protocol without intravenous contrast. Multiplanar CT image reconstructions of the cervical spine were also generated. RADIATION DOSE REDUCTION: This exam was performed according to the departmental dose-optimization program which includes automated exposure control, adjustment of the mA and/or kV according to patient size and/or use of iterative reconstruction technique. COMPARISON:  CT head and C-spine 09/28/2023 FINDINGS: CT HEAD FINDINGS Brain: Cerebral ventricle sizes are concordant with the degree of cerebral volume loss. Patchy and confluent areas of decreased attenuation are noted  throughout the deep and periventricular white matter of the cerebral hemispheres bilaterally, compatible with chronic microvascular ischemic disease. Bilateral basal ganglia mineralization nonspecific and benign. No evidence of large-territorial acute infarction. No parenchymal hemorrhage. No mass lesion. No extra-axial collection. No mass effect or midline shift. No hydrocephalus. Basilar cisterns are patent. Vascular: No hyperdense vessel. Atherosclerotic calcifications are present within the cavernous internal carotid arteries. Skull: No acute fracture or focal lesion. Sinuses/Orbits: Left sphenoid sinus mucosal thickening. Paranasal sinuses and mastoid air cells are clear. The orbits are unremarkable. Other: None. CT CERVICAL SPINE FINDINGS Alignment: Normal. Skull base and vertebrae: Moderate C3-C6 degenerative changes. Associated moderate to severe osseous neural foraminal stenosis of the left C3-C4 and C5-C6 level. No severe osseous central canal stenosis. No acute fracture. No aggressive appearing focal osseous lesion or focal pathologic process. Soft tissues and spinal canal: No prevertebral fluid or swelling. No visible canal hematoma. Upper chest: Biapical pleural/pulmonary scarring with associated calcification. Other: None. IMPRESSION: 1. No acute intracranial abnormality. 2. No acute displaced fracture or traumatic listhesis of the cervical spine. 3. Moderate to severe osseous neural foraminal stenosis of the left C3-C4 and C5-C6 level. Electronically Signed   By: Morgane  Naveau M.D.   On: 10/03/2023 22:47   CT Cervical Spine Wo Contrast Result Date: 10/03/2023 CLINICAL DATA:  Facial trauma, blunt Patient presents with family after mechanical fall where she hit her head on hoverboard at approx 2200. Denies blood thinners. Denies LOC or n/v. Oriented to self and place, disoriented to time and situation EXAM: CT HEAD WITHOUT CONTRAST CT CERVICAL SPINE WITHOUT CONTRAST TECHNIQUE: Multidetector CT  imaging of the head and cervical spine was performed following the standard protocol without intravenous contrast. Multiplanar CT image reconstructions of the cervical spine were also generated. RADIATION DOSE REDUCTION: This exam was performed according to the departmental dose-optimization program which includes automated exposure control, adjustment of the mA and/or kV according to patient size and/or use of iterative reconstruction technique. COMPARISON:  CT head and C-spine 09/28/2023 FINDINGS: CT HEAD FINDINGS Brain: Cerebral ventricle sizes are concordant with the degree of cerebral volume loss. Patchy and confluent areas of decreased attenuation are noted throughout the deep and periventricular white matter of the cerebral hemispheres bilaterally, compatible with chronic microvascular ischemic disease. Bilateral basal ganglia mineralization nonspecific and benign. No evidence of large-territorial acute infarction. No parenchymal hemorrhage. No mass lesion. No extra-axial collection. No mass effect or midline shift. No hydrocephalus. Basilar cisterns are patent. Vascular: No hyperdense vessel. Atherosclerotic calcifications are present within the cavernous internal carotid arteries. Skull: No acute fracture or focal lesion. Sinuses/Orbits: Left sphenoid sinus mucosal thickening. Paranasal sinuses and mastoid air cells are clear. The orbits are unremarkable. Other: None. CT CERVICAL SPINE FINDINGS Alignment: Normal. Skull base and vertebrae: Moderate C3-C6 degenerative changes. Associated moderate to severe osseous neural foraminal stenosis of the left C3-C4 and C5-C6 level. No  severe osseous central canal stenosis. No acute fracture. No aggressive appearing focal osseous lesion or focal pathologic process. Soft tissues and spinal canal: No prevertebral fluid or swelling. No visible canal hematoma. Upper chest: Biapical pleural/pulmonary scarring with associated calcification. Other: None. IMPRESSION: 1. No  acute intracranial abnormality. 2. No acute displaced fracture or traumatic listhesis of the cervical spine. 3. Moderate to severe osseous neural foraminal stenosis of the left C3-C4 and C5-C6 level. Electronically Signed   By: Morgane  Naveau M.D.   On: 10/03/2023 22:47    PROCEDURES:  Critical Care performed: No  Procedures   MEDICATIONS ORDERED IN ED: Medications - No data to display   IMPRESSION / MDM / ASSESSMENT AND PLAN / ED COURSE  I reviewed the triage vital signs and the nursing notes.  Differential diagnosis includes, but is not limited to: Intracranial bleed, skull fracture, spine fracture, no evidence of thoracoabdominal trauma  Patient's presentation is most consistent with acute presentation with potential threat to life or bodily function.  Patient presents after mechanical fall with head trauma. CT head and C-spine without acute traumatic injury. No new complaints on reeevaluation. Do think patient is stable for discharge. Strict return precautions provided.      FINAL CLINICAL IMPRESSION(S) / ED DIAGNOSES   Final diagnoses:  Closed head injury, initial encounter     Rx / DC Orders   ED Discharge Orders     None        Note:  This document was prepared using Dragon voice recognition software and may include unintentional dictation errors.   Levander Slate, MD 10/04/23 [phone]

## 2023-10-04 NOTE — Discharge Instructions (Signed)
 You were seen in the emergency department today after a head injury. Fortunately, your exam and CT scan were overall reassuring. It is still possible that you have a concussion. I have included more information about this in your paperwork. Please follow-up with your primary care doctor within a few days for reevaluation. Return to the ER for any worsening symptoms including worsening headache, confusion, or any other new or concerning symptoms.

## 2023-11-09 ENCOUNTER — Other Ambulatory Visit: Payer: Self-pay

## 2024-02-09 DIAGNOSIS — E860 Dehydration: Secondary | ICD-10-CM | POA: Diagnosis not present

## 2024-02-09 DIAGNOSIS — Z79899 Other long term (current) drug therapy: Secondary | ICD-10-CM | POA: Diagnosis not present

## 2024-02-09 DIAGNOSIS — R509 Fever, unspecified: Secondary | ICD-10-CM | POA: Diagnosis present

## 2024-02-09 DIAGNOSIS — R531 Weakness: Principal | ICD-10-CM | POA: Insufficient documentation

## 2024-02-09 DIAGNOSIS — I10 Essential (primary) hypertension: Secondary | ICD-10-CM | POA: Diagnosis not present

## 2024-02-09 DIAGNOSIS — R112 Nausea with vomiting, unspecified: Secondary | ICD-10-CM | POA: Diagnosis not present

## 2024-02-09 DIAGNOSIS — J111 Influenza due to unidentified influenza virus with other respiratory manifestations: Secondary | ICD-10-CM

## 2024-02-09 DIAGNOSIS — F0390 Unspecified dementia without behavioral disturbance: Secondary | ICD-10-CM | POA: Diagnosis not present

## 2024-02-09 DIAGNOSIS — N179 Acute kidney failure, unspecified: Secondary | ICD-10-CM | POA: Insufficient documentation

## 2024-02-09 DIAGNOSIS — D649 Anemia, unspecified: Secondary | ICD-10-CM | POA: Insufficient documentation

## 2024-02-09 DIAGNOSIS — R262 Difficulty in walking, not elsewhere classified: Secondary | ICD-10-CM | POA: Diagnosis not present

## 2024-02-09 DIAGNOSIS — J101 Influenza due to other identified influenza virus with other respiratory manifestations: Secondary | ICD-10-CM | POA: Insufficient documentation

## 2024-02-09 MED ADMIN — Lactated Ringer's Solution: 1000 mL | INTRAVENOUS | NDC 00264775000

## 2024-02-09 MED ADMIN — Acetaminophen Tab 500 MG: 1000 mg | ORAL | NDC 00904673061

## 2024-02-09 MED ADMIN — Losartan Potassium Tab 50 MG: 25 mg | ORAL | NDC 68084034711

## 2024-02-09 MED FILL — Losartan Potassium Tab 50 MG: 25.0000 mg | ORAL | Qty: 1 | Status: AC

## 2024-02-09 MED FILL — Acetaminophen Tab 500 MG: 1000.0000 mg | ORAL | Qty: 2 | Status: AC

## 2024-02-09 NOTE — ED Notes (Signed)
 Patient to xray.

## 2024-02-09 NOTE — ED Triage Notes (Signed)
 Per daughter patient has been in contact with family with the flu, has cough, fever, patient did not want to come but daughter wants evaluation, A +Ox3

## 2024-02-09 NOTE — ED Provider Notes (Signed)
 "  [facility] Provider Note    Event Date/Time   First MD Initiated Contact with Patient 02/09/24 2017     (approximate)   History   Chief Complaint Fever (Per daughter patient has been in contact with family with the flu, has cough, fever, patient did not want to come but daughter wants evaluation, A +Ox3)   HPI  Cindy Meyer is a 89 y.o. female with past medical history of hypertension who presents to the ED complaining of fever.  Patient's daughter at bedside reports that she has had some cough and congestion for the past couple of days after a family member recently tested positive for the flu.  She also developed some nausea with a couple episodes of vomiting earlier today, but has not had any diarrhea.  She has some dementia at baseline and daughter denies any change in her mental status.  History from the patient is limited due to dementia, but she currently denies any complaints.  She specifically denies any pain in her chest or difficulty breathing.     Physical Exam   Triage Vital Signs: ED Triage Vitals  Encounter Vitals Group     BP      Girls Systolic BP Percentile      Girls Diastolic BP Percentile      Boys Systolic BP Percentile      Boys Diastolic BP Percentile      Pulse      Resp      Temp      Temp src      SpO2      Weight      Height      Head Circumference      Peak Flow      Pain Score      Pain Loc      Pain Education      Exclude from Growth Chart     Most recent vital signs: Vitals:   02/09/24 2300 02/09/24 2311  BP: (!) 167/98 (!) 167/98  Pulse: (!) 104 (!) 105  Resp:  18  Temp:  98.8 F (37.1 C)  SpO2:  98%    Constitutional: Alert and oriented to person and place, but not time or situation. Eyes: Conjunctivae are normal. Head: Atraumatic. Nose: No congestion/rhinnorhea. Mouth/Throat: Mucous membranes are moist.  Cardiovascular: Normal rate, regular rhythm. Grossly normal heart sounds.  2+ radial  pulses bilaterally. Respiratory: Normal respiratory effort.  No retractions. Lungs CTAB. Gastrointestinal: Soft and nontender. No distention. Musculoskeletal: No lower extremity tenderness nor edema.  Neurologic:  Normal speech and language. No gross focal neurologic deficits are appreciated.    ED Results / Procedures / Treatments   Labs (all labs ordered are listed, but only abnormal results are displayed) Labs Reviewed  RESP PANEL BY RT-PCR (RSV, FLU A&B, COVID)  RVPGX2 - Abnormal; Notable for the following components:      Result Value   Influenza A by PCR POSITIVE (*)    All other components within normal limits  CBC WITH DIFFERENTIAL/PLATELET - Abnormal; Notable for the following components:   Hemoglobin 11.7 (*)    MCHC 29.8 (*)    Lymphs Abs 0.6 (*)    All other components within normal limits  COMPREHENSIVE METABOLIC PANEL WITH GFR - Abnormal; Notable for the following components:   Glucose, Bld 110 (*)    Creatinine, Ser 1.04 (*)    GFR, Estimated 49 (*)    All other components within normal limits  LIPASE, BLOOD    RADIOLOGY Chest x-ray reviewed and interpreted by me with no infiltrate, edema, or effusion.  PROCEDURES:  Critical Care performed: No  Procedures   MEDICATIONS ORDERED IN ED: Medications  lactated ringers  bolus 1,000 mL (0 mLs Intravenous Stopped 02/09/24 2330)  acetaminophen  (TYLENOL ) tablet 1,000 mg (1,000 mg Oral Given 02/09/24 2112)  losartan  (COZAAR ) tablet 25 mg (25 mg Oral Given 02/09/24 2313)     IMPRESSION / MDM / ASSESSMENT AND PLAN / ED COURSE  I reviewed the triage vital signs and the nursing notes.                              89 y.o. female with past medical history of hypertension and dementia who presents to the ED for cough and congestion with nausea and some vomiting over the past 2 to 3 days following exposure to flu.  Patient's presentation is most consistent with acute presentation with potential threat to life or  bodily function.  Differential diagnosis includes, but is not limited to, sepsis, pneumonia, dehydration, electrolyte abnormality, AKI, influenza, COVID-19, UTI.  Patient nontoxic-appearing and in no acute distress, vital signs remarkable for borderline fever at 100.2 as well as mild tachycardia.  She is not in any respiratory distress and maintaining oxygen saturations at 98% on room air.  Sepsis considered but viral etiology of her symptoms seems much more likely, will check viral panel as well as chest x-ray.  Plan to treat borderline fever with Tylenol  and hydrate with IV fluids, reassess following results.  Viral panel confirms influenza, chest x-ray negative for pneumonia or other acute finding.  Labs without significant anemia, leukocytosis, or electrolyte abnormality but she does appear to have mild AKI.  LFTs are unremarkable.  Patient continues to breathe comfortably on reassessment but has been unable to walk at home with decreased oral intake.  Case discussed with hospitalist for admission for generalized weakness related to influenza.      FINAL CLINICAL IMPRESSION(S) / ED DIAGNOSES   Final diagnoses:  Influenza  Generalized weakness     Rx / DC Orders   ED Discharge Orders     None        Note:  This document was prepared using Dragon voice recognition software and may include unintentional dictation errors.   Willo Dunnings, MD 02/09/24 2336  "

## 2024-02-10 DIAGNOSIS — R531 Weakness: Principal | ICD-10-CM

## 2024-02-10 MED ADMIN — Guaifenesin Tab ER 12HR 600 MG: 600 mg | ORAL | NDC 68084057211

## 2024-02-10 MED ADMIN — Mirtazapine Tab 15 MG: 7.5 mg | ORAL | NDC 00904651961

## 2024-02-10 MED ADMIN — Dextromethorphan-Guaifenesin Syrup 10-100 MG/5ML: 5 mL | ORAL | NDC 00121127610

## 2024-02-10 MED ADMIN — Losartan Potassium Tab 25 MG: 25 mg | ORAL | NDC 00904704761

## 2024-02-10 MED ADMIN — THERA M PLUS PO TABS: 1 | ORAL | NDC 1650058694

## 2024-02-10 MED ADMIN — Enoxaparin Sodium Inj Soln Pref Syr 40 MG/0.4ML: 40 mg | SUBCUTANEOUS | NDC 71839011010

## 2024-02-10 MED FILL — Multiple Vitamin Tab: 1.0000 | ORAL | Qty: 1 | Status: AC

## 2024-02-10 MED FILL — Losartan Potassium Tab 25 MG: 25.0000 mg | ORAL | Qty: 1 | Status: AC

## 2024-02-10 MED FILL — Enoxaparin Sodium Inj Soln Pref Syr 40 MG/0.4ML: 40.0000 mg | INTRAMUSCULAR | Qty: 0.4 | Status: AC

## 2024-02-10 MED FILL — Mirtazapine Tab 15 MG: 7.5000 mg | ORAL | Qty: 1 | Status: AC

## 2024-02-10 MED FILL — Guaifenesin Tab ER 12HR 600 MG: 600.0000 mg | ORAL | Qty: 1 | Status: AC

## 2024-02-10 MED FILL — Dextromethorphan-Guaifenesin Syrup 10-100 MG/5ML: 5.0000 mL | ORAL | Qty: 10 | Status: AC

## 2024-02-10 NOTE — Evaluation (Signed)
 Occupational Therapy Evaluation Patient Details Name: Cindy Meyer MRN: [id number] DOB: September 17, 1926 Today's Date: 02/10/2024   History of Present Illness   89 year old with a history of HTN and dementia who presented to the ER 12/31 after being exposed to a family member with confirmed influenza.  The patient herself reported a fever with cough and congestion for a couple of days, as well as nausea with a few limited episodes of vomiting.  CXR was negative for acute infiltrate.  Labs confirmed and anemia as well as mild acute kidney injury.  She was influenza A positive.  The patient was unable to walk due to severe generalized weakness and was observed to have very limited to essentially no oral intake while in the ER.     Clinical Impressions Patient presenting with decreased Ind in self care,balance, functional mobility, and transfers. Pt lives at home with family. Her daughter is present in room at time of evaluation. She does have cognitive deficits at baseline and daughter correct some of her answers to PLOF questions during this assessment. Pt performs bed mobility with min A and stands with RW to ambulate to door with min guard . She needs min cues for upright position and forward head as she stares at feet when ambulating. Pt's brief soaked with urine. She stand with RW with min guard for balance and performs peri hygiene. OT assists pt with doffing soiled brief and donning clean one. Min A to return to supine. Pt is likely close to baseline. Patient will benefit from acute OT to increase overall independence in the areas of ADLs, functional mobility, and safety awareness in order to safely discharge.     If plan is discharge home, recommend the following:   A little help with walking and/or transfers;A little help with bathing/dressing/bathroom;Assistance with cooking/housework;Direct supervision/assist for financial management;Direct supervision/assist for medications management;Help  with stairs or ramp for entrance;Supervision due to cognitive status     Functional Status Assessment   Patient has had a recent decline in their functional status and demonstrates the ability to make significant improvements in function in a reasonable and predictable amount of time.     Equipment Recommendations   None recommended by OT      Precautions/Restrictions   Precautions Precautions: Fall     Mobility Bed Mobility Overal bed mobility: Needs Assistance Bed Mobility: Supine to Sit, Sit to Supine     Supine to sit: Min assist, Used rails Sit to supine: Min assist, Used rails        Transfers Overall transfer level: Needs assistance Equipment used: Rolling walker (2 wheels) Transfers: Sit to/from Stand Sit to Stand: Contact guard assist                  Balance Overall balance assessment: Needs assistance Sitting-balance support: Feet supported Sitting balance-Leahy Scale: Good     Standing balance support: Reliant on assistive device for balance, During functional activity, Bilateral upper extremity supported Standing balance-Leahy Scale: Fair                             ADL either performed or assessed with clinical judgement   ADL Overall ADL's : Needs assistance/impaired                     Lower Body Dressing: Minimal assistance Lower Body Dressing Details (indicate cue type and reason): assistance to don non slip grip socks  General ADL Comments: Pt stands with min guard and performs peri hygiene and therapist assists with changing brief     Vision Patient Visual Report: No change from baseline              Pertinent Vitals/Pain Pain Assessment Pain Assessment: No/denies pain     Extremity/Trunk Assessment Upper Extremity Assessment Upper Extremity Assessment: Generalized weakness   Lower Extremity Assessment Lower Extremity Assessment: Generalized weakness       Communication  Communication Communication: No apparent difficulties   Cognition Arousal: Alert Behavior During Therapy: WFL for tasks assessed/performed Cognition: History of cognitive impairments             OT - Cognition Comments: per chart, dementia at baseline.                 Following commands: Impaired Following commands impaired: Follows one step commands with increased time     Cueing  General Comments   Cueing Techniques: Verbal cues              Home Living Family/patient expects to be discharged to:: Private residence Living Arrangements: Children Available Help at Discharge: Family;Available PRN/intermittently (lives with daughter who works during the day. Granddaughter also there.) Type of Home: House Home Access: Stairs to enter [employer] of Steps: 1   Home Layout: Two level;Able to live on main level with bedroom/bathroom     Bathroom Shower/Tub: Sponge bathes at baseline         Home Equipment: [employer] (2 wheels);BSC/3in1          Prior Functioning/Environment Prior Level of Function : Needs assist;Independent/Modified Independent             Mobility Comments: Pt ambulates with RW at baseline ADLs Comments: Pt sponge baths herself and dresses self. She needs assistance with IADLs from family.    OT Problem List: Decreased strength;Decreased activity tolerance;Impaired balance (sitting and/or standing);Decreased safety awareness   OT Treatment/Interventions: Self-care/ADL training;Balance training;Therapeutic exercise;Therapeutic activities;Energy conservation;Patient/family education      OT Goals(Current goals can be found in the care plan section)   Acute Rehab OT Goals Patient Stated Goal: to get better OT Goal Formulation: With patient/family Time For Goal Achievement: 02/10/24 Potential to Achieve Goals: Fair ADL Goals Pt Will Perform Grooming: with modified independence;standing Pt Will Perform Lower  Body Dressing: with supervision;sit to/from stand Pt Will Transfer to Toilet: with supervision;ambulating Pt Will Perform Toileting - Clothing Manipulation and hygiene: with supervision;sit to/from stand   OT Frequency:  Min 2X/week       AM-PAC OT 6 Clicks Daily Activity     Outcome Measure Help from another person eating meals?: None Help from another person taking care of personal grooming?: None Help from another person toileting, which includes using toliet, bedpan, or urinal?: A Little Help from another person bathing (including washing, rinsing, drying)?: A Little Help from another person to put on and taking off regular upper body clothing?: None Help from another person to put on and taking off regular lower body clothing?: A Little 6 Click Score: 21   End of Session Equipment Utilized During Treatment: Rolling walker (2 wheels) Nurse Communication: Mobility status  Activity Tolerance: Patient tolerated treatment well Patient left: in bed;with call bell/phone within reach;with bed alarm set;with family/visitor present  OT Visit Diagnosis: Unsteadiness on feet (R26.81);Muscle weakness (generalized) (M62.81)                Time: 9090-9074 OT Time Calculation (min): 16  min Charges:  OT General Charges $OT Visit: 1 Visit OT Evaluation $OT Eval Low Complexity: 1 Low Izetta Claude, MS, OTR/L , CBIS ascom [phone]  02/10/2024, 9:41 AM

## 2024-02-10 NOTE — ED Notes (Signed)
 Patient felt she needed to urinate, placed on bedpan but could not urinate, states she no longer has the urge, turned and repositioned

## 2024-02-10 NOTE — Plan of Care (Signed)

## 2024-02-10 NOTE — Evaluation (Signed)
 Physical Therapy Evaluation Patient Details Name: Cindy Meyer MRN: [id number] DOB: 05/08/1926 Today's Date: 02/10/2024  History of Present Illness  89 year old with a history of HTN and dementia who presented to the ER 12/31 after being exposed to a family member with confirmed influenza.  The patient herself reported a fever with cough and congestion for a couple of days, as well as nausea with a few limited episodes of vomiting.  CXR was negative for acute infiltrate.  Labs confirmed and anemia as well as mild acute kidney injury.  She was influenza A positive.  The patient was unable to walk due to severe generalized weakness and was observed to have very limited to essentially no oral intake while in the ER.  Clinical Impression  Patient received in bed with daughter at bedside. Patient is pleasant and agrees to assessment. She requires cga/min A for bed mobility and sit to stand transfers. She was able to ambulated ~15 feet in room with rolling walker, cga. Patient is weaker than her baseline, and will continue to benefit from skilled PT while here.            If plan is discharge home, recommend the following: A little help with walking and/or transfers;A little help with bathing/dressing/bathroom;Assist for transportation;Help with stairs or ramp for entrance   Can travel by private vehicle    yes    Equipment Recommendations None recommended by PT  Recommendations for Other Services       Functional Status Assessment Patient has had a recent decline in their functional status and demonstrates the ability to make significant improvements in function in a reasonable and predictable amount of time.     Precautions / Restrictions Precautions Precautions: Fall Recall of Precautions/Restrictions: Impaired Restrictions Weight Bearing Restrictions Per Provider Order: No      Mobility  Bed Mobility Overal bed mobility: Needs Assistance Bed Mobility: Supine to Sit, Sit to Supine      Supine to sit: Min assist, Used rails, HOB elevated Sit to supine: Min assist        Transfers Overall transfer level: Needs assistance Equipment used: Rolling walker (2 wheels) Transfers: Sit to/from Stand Sit to Stand: Contact guard assist, Min assist           General transfer comment: posterior leaning with initial standing    Ambulation/Gait Ambulation/Gait assistance: Contact guard assist Gait Distance (Feet): 15 Feet Assistive device: Rolling walker (2 wheels) Gait Pattern/deviations: Step-through pattern, Decreased step length - right, Decreased step length - left, Decreased stride length, Trunk flexed Gait velocity: decr     General Gait Details: patient ambulated with RW in room ~ 15 feet. Cues needed for upright posture throughout session. When cued, she is able to correct but quickly returns to flexted posture.  Stairs            Wheelchair Mobility     Tilt Bed    Modified Rankin (Stroke Patients Only)       Balance Overall balance assessment: Needs assistance Sitting-balance support: Feet supported Sitting balance-Leahy Scale: Normal   Postural control: Posterior lean Standing balance support: Bilateral upper extremity supported, During functional activity, Reliant on assistive device for balance Standing balance-Leahy Scale: Fair                               Pertinent Vitals/Pain Pain Assessment Pain Assessment: No/denies pain    Home Living Family/patient expects to be discharged to:: Private  residence Living Arrangements: Children Available Help at Discharge: Family;Available PRN/intermittently Type of Home: House Home Access: Stairs to enter   Entrance Stairs-Number of Steps: 1   Home Layout: Two level;Able to live on main level with bedroom/bathroom Home Equipment: Rolling Walker (2 wheels);BSC/3in1      Prior Function Prior Level of Function : Needs assist;Independent/Modified Independent              Mobility Comments: Pt ambulates with RW at baseline ADLs Comments: Pt sponge baths herself and dresses self. She needs assistance with IADLs from family.     Extremity/Trunk Assessment   Upper Extremity Assessment Upper Extremity Assessment: Defer to OT evaluation    Lower Extremity Assessment Lower Extremity Assessment: Generalized weakness    Cervical / Trunk Assessment Cervical / Trunk Assessment:  (flexed posture during mobility, requires cues to stand upright)  Communication   Communication Communication: No apparent difficulties    Cognition Arousal: Alert Behavior During Therapy: WFL for tasks assessed/performed   PT - Cognitive impairments: History of cognitive impairments                         Following commands: Impaired Following commands impaired: Follows one step commands with increased time     Cueing Cueing Techniques: Verbal cues     General Comments      Exercises     Assessment/Plan    PT Assessment Patient needs continued PT services  PT Problem List Decreased strength;Decreased activity tolerance;Decreased balance;Decreased mobility;Decreased safety awareness       PT Treatment Interventions DME instruction;Gait training;Functional mobility training;Therapeutic activities;Therapeutic exercise;Balance training;Patient/family education    PT Goals (Current goals can be found in the Care Plan section)  Acute Rehab PT Goals Patient Stated Goal: improve PT Goal Formulation: With patient/family Time For Goal Achievement: 02/24/24 Potential to Achieve Goals: Good    Frequency Min 2X/week     Co-evaluation               AM-PAC PT 6 Clicks Mobility  Outcome Measure Help needed turning from your back to your side while in a flat bed without using bedrails?: A Little Help needed moving from lying on your back to sitting on the side of a flat bed without using bedrails?: A Little Help needed moving to and from a bed to a  chair (including a wheelchair)?: A Little Help needed standing up from a chair using your arms (e.g., wheelchair or bedside chair)?: A Little Help needed to walk in hospital room?: A Little Help needed climbing 3-5 steps with a railing? : A Lot 6 Click Score: 17    End of Session   Activity Tolerance: Patient tolerated treatment well Patient left: in bed;with call bell/phone within reach;with bed alarm set;with family/visitor present Nurse Communication: Mobility status PT Visit Diagnosis: Other abnormalities of gait and mobility (R26.89);Muscle weakness (generalized) (M62.81);Difficulty in walking, not elsewhere classified (R26.2);Unsteadiness on feet (R26.81)    Time: 9090-9074 PT Time Calculation (min) (ACUTE ONLY): 16 min   Charges:   PT Evaluation $PT Eval Low Complexity: 1 Low   PT General Charges $$ ACUTE PT VISIT: 1 Visit         Sada Mazzoni, PT, GCS 02/10/2024,11:01 AM

## 2024-02-10 NOTE — H&P (Signed)
 "  ADMISSION HISTORY AND PHYSICAL   Cindy Meyer FMW:[id number] DOB: 12/31/1926 DOA: 02/09/2024  PCP: Pcp, No Patient coming from: home via [facility] ED  Chief Complaint: fever - influenza exposure   HPI:  89 year old with a history of HTN and dementia who presented to the ER 12/31 after being exposed to a family member with confirmed influenza.  The patient herself reported a fever with cough and congestion for a couple of days, as well as nausea with a few limited episodes of vomiting.  CXR was negative for acute infiltrate.  Labs confirmed and anemia as well as mild acute kidney injury.  She was influenza A positive.  The patient was unable to walk due to severe generalized weakness and was observed to have very limited to essentially no oral intake while in the ER.  Assessment/Plan  Influenza A No signif resp sx - no persisting fever - most worrisome features are her poor intake and N/V leading to [facility], as discussed below - no indication for Tamiflu given clinical situation   Dehydration with nausea/vomiting and very poor oral intake - severe generalized weakness Due to influenza - continue gentle IV fluid support - PT/OT to eval as priority - if she is able to get up and ambulate now that she has received volume resuscitation, and if she is eating and drinking better today, she could potentially go home   Mild AKI Creatinine 1.04 at presentation with GFR of 49 - no baseline labs available for comparison - hold usual ARB and follow crt in serial fashion - suspect simple pre-renal state w/ pt now volume resuscitated   HTN Practice permissive HTN for now in setting of advanced age and clinical evidence of dehydration - avoid overaggressive correction of BP w/ goal to simply avoid extremes   Normocytic anemia Hemoglobin 11.7 with MCV of 88 at presentation - likely nutritionally related - given advanced age GI eval not indicated/appropriate - will check anemia panel to determine if simple  supplementation of Fe, folate, or B12 could be beneficial    DVT prophylaxis: lovenox  Code Status:   Code Status: Full Code Family Communication: spoke w/ daughter at bedside  Disposition Plan:  place in observation - priority PT/OT evals - encourage oral intake   Review of Systems: As per HPI otherwise 10 point review of systems negative.   Medical History HTN Dementia   Family History  No family history on file. Fam hx of no significance given advanced age.   Social History   has no history on file for tobacco use, alcohol use, and drug use. Lives in [place] with her daughter.   Allergies Allergies[1]  Prior to Admission medications  Medication Sig Start Date End Date Taking? Authorizing Provider  losartan  (COZAAR ) 25 MG tablet Take 25 mg by mouth daily. 10/18/19  Yes [provider]  mirtazapine  (REMERON ) 7.5 MG tablet Take 7.5 mg by mouth at bedtime.   Yes [provider]  Multiple Vitamin (MULTIVITAMIN WITH MINERALS) TABS tablet Take 1 tablet by mouth daily.   Yes [provider]    Physical Exam: Vitals:   02/10/24 0500 02/10/24 0530 02/10/24 0600 02/10/24 0700  BP: (!) 155/74 (!) 171/85 (!) 177/111 (!) 145/79  Pulse: 81 77 82 87  Resp:  16 16 16   Temp:    98.8 F (37.1 C)  TempSrc:    Oral  SpO2: 99% 100% 100% 100%  Weight:      Height:  General: No acute respiratory distress - alert and conversant - pleasant  Lungs: Clear to auscultation bilaterally without wheezes or crackles Cardiovascular: Regular rate and rhythm without murmur gallop or rub normal S1 and S2 Abdomen: Nontender, nondistended, soft, bowel sounds positive, no rebound, no ascites, no appreciable mass Extremities: No significant cyanosis, clubbing, or edema bilateral lower extremities Neurologic: non-focal neuro exam - CN2-12 intact B    Labs on Admission:   CBC: Recent Labs  Lab 02/09/24 2116  WBC 6.6  NEUTROABS 5.4  HGB 11.7*  HCT 39.2  MCV 88.3   PLT 204   Basic Metabolic Panel: Recent Labs  Lab 02/09/24 2116  NA 140  K 3.7  CL 103  CO2 27  GLUCOSE 110*  BUN 13  CREATININE 1.04*  CALCIUM 9.7   GFR: Estimated Creatinine Clearance: 30.3 mL/min (A) (by C-G formula based on SCr of 1.04 mg/dL (H)).  Liver Function Tests: Recent Labs  Lab 02/09/24 2116  AST 40  ALT 6  ALKPHOS 66  BILITOT 0.5  PROT 7.9  ALBUMIN 3.9   Recent Labs  Lab 02/09/24 2116  LIPASE 26    Radiological Exams on Admission: DG Chest 2 View Result Date: 02/09/2024 IMPRESSION: 1. No acute process. Electronically signed by: Vanetta Chou MD 02/09/2024 09:32 PM EST RP Workstation: [id number]    Reyes IVAR Moores, MD [facility] Office  [phone] Pager - Text Page per Amion as per below:  On-Call/Text Page:      [email or web address]  If 7PM-7AM, please contact night-coverage [email or web address] 02/10/2024, 8:54 AM        [1] No Known Allergies  "

## 2024-02-10 NOTE — Progress Notes (Signed)
" ° °  Brief Progress Note   _____________________________________________________________________________________________________________  Patient Name: Cindy Meyer Patient DOB: 08-Oct-1926 Date: 02/10/2024     Data: Dr Danton ordered PT/OT evals.    Action: Chart reviewed for completion.    Response:  Sent Dr Danton secure chat that both PT & OT evals have been completed.  _____________________________________________________________________________________________________________  [facility] RN Expeditor Jemmie Rhinehart Please contact us  directly via secure chat (search for [facility]) or by calling us  at [phone] [facility]).  "

## 2024-02-11 DIAGNOSIS — J111 Influenza due to unidentified influenza virus with other respiratory manifestations: Secondary | ICD-10-CM

## 2024-02-11 DIAGNOSIS — R531 Weakness: Secondary | ICD-10-CM | POA: Diagnosis not present

## 2024-02-11 MED ADMIN — Potassium Chloride Microencapsulated Crys ER Tab 20 mEq: 40 meq | ORAL | NDC 70010013501

## 2024-02-11 MED ADMIN — THERA M PLUS PO TABS: 1 | ORAL | NDC 1650058694

## 2024-02-11 MED ADMIN — Enoxaparin Sodium Inj Soln Pref Syr 40 MG/0.4ML: 40 mg | SUBCUTANEOUS | NDC 00781324602

## 2024-02-11 MED ADMIN — Losartan Potassium Tab 50 MG: 50 mg | ORAL | NDC 68084034711

## 2024-02-11 MED FILL — Potassium Chloride Microencapsulated Crys ER Tab 20 mEq: 40.0000 meq | ORAL | Qty: 2 | Status: AC

## 2024-02-11 MED FILL — Losartan Potassium Tab 50 MG: 50.0000 mg | ORAL | Qty: 1 | Status: AC

## 2024-02-11 NOTE — TOC Transition Note (Signed)
 Transition of Care [facility]) - Discharge Note   Patient Details  Name: Cindy Meyer MRN: [id number] Date of Birth: 22-Jul-1926  Transition of [facility]) CM/SW Contact:  [name] Hamilton, RN Phone Number: 02/11/2024, 2:54 PM   Clinical Narrative:     Patient will DC to: Home with [facility] Anticipated DC date: 02/11/2024 Family notified: Daughter [name] in the room with patient.  Transport by: [name]  Per MD patient ready for DC to Home with HH. RN, patient, patient's family, and agency notified of DC. Home Health order sent to agency. DC packet on chart.    TOC signing off.   Final next level of care: Home w Home Health Services Barriers to Discharge: Barriers Resolved   Patient Goals and CMS Choice     Choice offered to / list presented to : Patient, Adult Children      Discharge Placement                    Patient and family notified of of transfer: 02/11/24  Discharge Plan and Services Additional resources added to the After Visit Summary for                            [facility] Arranged: PT HH Agency: [facility] Date [facility] Agency Contacted: 02/11/24 Time HH Agency Contacted: 1236 Representative spoke with at [facility] Agency: [place]  Social Drivers of Health (SDOH) Interventions SDOH Screenings   Food Insecurity: No Food Insecurity (02/10/2024)  Housing: Low Risk (02/10/2024)  Transportation Needs: No Transportation Needs (02/10/2024)  Utilities: Not At Risk (02/10/2024)  Social Connections: Moderately Isolated (02/10/2024)     Readmission Risk Interventions     No data to display

## 2024-02-11 NOTE — Progress Notes (Signed)
 Occupational Therapy Treatment Patient Details Name: Cindy Meyer MRN: [id number] DOB: [birth year] Today's Date: 02/11/2024   History of present illness 89 year old with a history of HTN and dementia who presented to the ER 12/31 after being exposed to a family member with confirmed influenza.  The patient herself reported a fever with cough and congestion for a couple of days, as well as nausea with a few limited episodes of vomiting.  CXR was negative for acute infiltrate.  Labs confirmed and anemia as well as mild acute kidney injury.  She was influenza A positive.  The patient was unable to walk due to severe generalized weakness and was observed to have very limited to essentially no oral intake while in the ER.   OT comments  Patient seen for OT treatment on this date. Upon arrival to room patient resting in recliner, agreeable to treatment. OT facilitated ADL in preparation for discharge. Patient is at baseline and requires mod-max A for ADLs. OT provided gait belt to daughter to ensure safety with sit<>stand.  Patient ended treatment in recliner  and all needs within reach. Patient making good progress toward goals, will continue to follow POC. Discharge recommendation remains appropriate.        If plan is discharge home, recommend the following:  A little help with walking and/or transfers;A little help with bathing/dressing/bathroom;Assistance with cooking/housework;Direct supervision/assist for financial management;Direct supervision/assist for medications management;Help with stairs or ramp for entrance;Supervision due to cognitive status   Equipment Recommendations  None recommended by OT    Recommendations for Other Services      Precautions / Restrictions Precautions Precautions: Fall Recall of Precautions/Restrictions: Impaired Restrictions Weight Bearing Restrictions Per Provider Order: No       Mobility Bed Mobility               General bed mobility comments:  OOB pre/post tx    Transfers Overall transfer level: Needs assistance Equipment used: Rolling walker (2 wheels) Transfers: Sit to/from Stand Sit to Stand: Mod assist           General transfer comment: provided family with gait belt, mod lifting A     Balance Overall balance assessment: Needs assistance Sitting-balance support: Feet supported Sitting balance-Leahy Scale: Normal   Postural control: Posterior lean Standing balance support: Bilateral upper extremity supported, During functional activity, Reliant on assistive device for balance Standing balance-Leahy Scale: Fair                             ADL either performed or assessed with clinical judgement   ADL Overall ADL's : At baseline                                       General ADL Comments: requires A for all parts of ADLs due to cog deficits    Extremity/Trunk Assessment              Vision       Perception     Praxis     Communication Communication Communication: No apparent difficulties   Cognition Arousal: Alert Behavior During Therapy: WFL for tasks assessed/performed Cognition: History of cognitive impairments             OT - Cognition Comments: per chart, dementia at baseline.  Following commands: Impaired Following commands impaired: Follows one step commands with increased time      Cueing   Cueing Techniques: Verbal cues  Exercises      Shoulder Instructions       General Comments      Pertinent Vitals/ Pain       Pain Assessment Pain Assessment: No/denies pain  Home Living                                          Prior Functioning/Environment              Frequency  Min 2X/week        Progress Toward Goals  OT Goals(current goals can now be found in the care plan section)  Progress towards OT goals: Progressing toward goals  Acute Rehab OT Goals Patient Stated Goal: to go  home OT Goal Formulation: With patient/family Time For Goal Achievement: 02/10/24 Potential to Achieve Goals: Fair ADL Goals Pt Will Perform Grooming: with modified independence;standing Pt Will Perform Lower Body Dressing: with supervision;sit to/from stand Pt Will Transfer to Toilet: with supervision;ambulating Pt Will Perform Toileting - Clothing Manipulation and hygiene: with supervision;sit to/from stand  Plan      Co-evaluation                 AM-PAC OT 6 Clicks Daily Activity     Outcome Measure   Help from another person eating meals?: None Help from another person taking care of personal grooming?: None Help from another person toileting, which includes using toliet, bedpan, or urinal?: A Little Help from another person bathing (including washing, rinsing, drying)?: A Little Help from another person to put on and taking off regular upper body clothing?: None Help from another person to put on and taking off regular lower body clothing?: A Little 6 Click Score: 21    End of Session Equipment Utilized During Treatment: Rolling walker (2 wheels)  OT Visit Diagnosis: Unsteadiness on feet (R26.81);Muscle weakness (generalized) (M62.81)   Activity Tolerance Patient tolerated treatment well   Patient Left with family/visitor present;in chair   Nurse Communication Mobility status        Time: 8679-8662 OT Time Calculation (min): 17 min  Charges: OT General Charges $OT Visit: 1 Visit OT Treatments $Self Care/Home Management : 8-22 mins  Rogers Clause, OT/L MSOT, 02/11/2024

## 2024-02-11 NOTE — Plan of Care (Signed)

## 2024-02-11 NOTE — Plan of Care (Signed)
  Problem: Safety: Goal: Ability to remain free from injury will improve Outcome: Progressing   Problem: Elimination: Goal: Will not experience complications related to bowel motility Outcome: Progressing   Problem: Skin Integrity: Goal: Risk for impaired skin integrity will decrease Outcome: Progressing   

## 2024-02-11 NOTE — Progress Notes (Signed)
 Patient has been discharged home with family and home health to follow. She is alert with some confusion. Patient is able to walk with stand by assist with walker.  Discharge instructions have been given to daughter with understanding. Staff to transport patient lobby for exit.

## 2024-02-11 NOTE — Discharge Instructions (Signed)
 Please continue to distance and wear a mask when in close proximity for the next couple of days to help prevent spread of flu.   We have ordered home health PT to start up again in home. Continue to stay active and mobile as much as you feel up to in order to keep your strength up.

## 2024-02-14 ENCOUNTER — Encounter: Payer: Self-pay | Admitting: Emergency Medicine

## 2024-03-13 ENCOUNTER — Other Ambulatory Visit: Payer: Self-pay

## 2024-03-13 ENCOUNTER — Emergency Department

## 2024-03-13 ENCOUNTER — Emergency Department: Admission: EM | Admit: 2024-03-13 | Discharge: 2024-03-13 | Disposition: A | Discharge status: Home / Self Care

## 2024-03-13 ENCOUNTER — Encounter: Payer: Self-pay | Admitting: Emergency Medicine

## 2024-03-13 DIAGNOSIS — Y9301 Activity, walking, marching and hiking: Secondary | ICD-10-CM | POA: Insufficient documentation

## 2024-03-13 DIAGNOSIS — I10 Essential (primary) hypertension: Secondary | ICD-10-CM | POA: Insufficient documentation

## 2024-03-13 DIAGNOSIS — W19XXXA Unspecified fall, initial encounter: Secondary | ICD-10-CM | POA: Insufficient documentation

## 2024-03-13 DIAGNOSIS — Y92000 Kitchen of unspecified non-institutional (private) residence as  the place of occurrence of the external cause: Secondary | ICD-10-CM | POA: Insufficient documentation

## 2024-03-13 LAB — CBC
HCT: 34.9 % — ABNORMAL LOW (ref 36.0–46.0)
Hemoglobin: 10.7 g/dL — ABNORMAL LOW (ref 12.0–15.0)
MCH: 26.5 pg (ref 26.0–34.0)
MCHC: 30.7 g/dL (ref 30.0–36.0)
MCV: 86.4 fL (ref 80.0–100.0)
Platelets: 211 10*3/uL (ref 150–400)
RBC: 4.04 MIL/uL (ref 3.87–5.11)
RDW: 15.2 % (ref 11.5–15.5)
WBC: 6.5 10*3/uL (ref 4.0–10.5)
nRBC: 0 % (ref 0.0–0.2)

## 2024-03-13 LAB — BASIC METABOLIC PANEL WITH GFR
Anion gap: 11 (ref 5–15)
BUN: 9 mg/dL (ref 8–23)
CO2: 26 mmol/L (ref 22–32)
Calcium: 9.4 mg/dL (ref 8.9–10.3)
Chloride: 105 mmol/L (ref 98–111)
Creatinine, Ser: 0.92 mg/dL (ref 0.44–1.00)
GFR, Estimated: 57 mL/min — ABNORMAL LOW
Glucose, Bld: 120 mg/dL — ABNORMAL HIGH (ref 70–99)
Potassium: 3.7 mmol/L (ref 3.5–5.1)
Sodium: 142 mmol/L (ref 135–145)

## 2024-03-13 MED ORDER — HYDRALAZINE HCL 10 MG PO TABS
10.0000 mg | ORAL_TABLET | Freq: Once | ORAL | Status: AC
  Administered 2024-03-13: 10 mg via ORAL
  Filled 2024-03-13: qty 1

## 2024-03-13 NOTE — ED Triage Notes (Signed)
 Pt in via [facility] from home; per EMS, patient with fall in the kitchen but is having difficulty recalling the incident.  Per family, patient did not hit head, denies LOC.  Patient does not have any complaints at this time.  Family asked that she be evaluated.  NAD noted at this time.

## 2024-03-13 NOTE — ED Provider Notes (Signed)
 "  [facility] Provider Note    Event Date/Time   First MD Initiated Contact with Patient 03/13/24 1920     (approximate)   History   Fall   HPI  Cindy Meyer is a 89 y.o. female with a past medical history of hypertension presenting to the emergency department via EMS from home after a fall.  The patient reports that she was walking from her living room into her kitchen when she suddenly fell.  She is not certain what caused her to fall.  The patient states that she did not hit her head and family reported to EMS that she did not hit her head.  They deny loss of consciousness.  Denied blood thinners.  Patient denies any pain in her head, neck, back, chest, abdomen, hips, or extremities.     Physical Exam   Triage Vital Signs: ED Triage Vitals [03/13/24 1926]  Encounter Vitals Group     BP      Girls Systolic BP Percentile      Girls Diastolic BP Percentile      Boys Systolic BP Percentile      Boys Diastolic BP Percentile      Pulse      Resp      Temp      Temp src      SpO2      Weight      Height      Head Circumference      Peak Flow      Pain Score 0     Pain Loc      Pain Education      Exclude from Growth Chart     Most recent vital signs: There were no vitals filed for this visit.   General: Awake, no distress.  CV:  Good peripheral perfusion.  Resp:  Normal effort.  Abd:  No distention.  Other:     ED Results / Procedures / Treatments   Labs (all labs ordered are listed, but only abnormal results are displayed) Labs Reviewed  BASIC METABOLIC PANEL WITH GFR  CBC     EKG  ED ECG REPORT I, Reche CHRISTELLA Leventhal, the attending physician, personally viewed and interpreted this ECG.  Date: 03/13/2024 Rate: 83 bpm Rhythm: normal sinus rhythm QRS Axis: normal Intervals: normal ST/T Wave abnormalities: normal Narrative Interpretation: no evidence of acute ischemia    RADIOLOGY Head CT: No acute intracranial  pathology  Cervical CT: No acute traumatic process    PROCEDURES:  Critical Care performed: No  Procedures   MEDICATIONS ORDERED IN ED: Medications - No data to display   IMPRESSION / MDM / ASSESSMENT AND PLAN / ED COURSE  I reviewed the triage vital signs and the nursing notes.                               Patient's presentation is most consistent with acute illness / injury with system symptoms.  Patient is a 89 year old female with a past medical history of hypertension presenting to the emergency department via EMS from home after a fall.  EKG and lab work ordered for further evaluation as the patient currently has no complaints.  The patient's daughter has arrived in the emergency department and at this time reports that she heard the patient fall and then the patient screamed and when she went to help her off the floor she complained of a headache.  The patient states that the headache has resolved at this time that she forgot she had it.  Daughter states that she wanted the patient to come here because she was concerned she needed head CT.  She also said that the patient's blood pressure has been high recently.  After discussion plan was made to treat the patient with a dose of hydralazine  here in the ER.  CT of the head neck was unremarkable.  I discussed results at length with the patient and her daughter the results of imaging and lab work.  Discussed following up with PCP to discuss better blood pressure control.  Return to the emergency department for any new or worsening symptoms.      FINAL CLINICAL IMPRESSION(S) / ED DIAGNOSES   Final diagnoses:  Fall, initial encounter  Hypertension, unspecified type     Rx / DC Orders   ED Discharge Orders     None        Note:  This document was prepared using Dragon voice recognition software and may include unintentional dictation errors.   [place] Reche HERO, MD 03/13/24 2137  "

## 2024-03-13 NOTE — ED Notes (Signed)
 Patient transported to CT

## 2024-03-13 NOTE — Discharge Instructions (Signed)
 Please continue to keep a log of your blood pressures.  Follow-up with your primary care physician within the next few days to discuss better blood pressure control.  Return to the emergency department for any new or worsening symptoms.
